# Patient Record
Sex: Female | Born: 1990 | Race: White | Hispanic: No | Marital: Single | Smoking: Former smoker
Health system: Southern US, Community
[De-identification: ages and names within clinical notes are randomized; demographics above are authoritative.]

## PROBLEM LIST (undated history)

## (undated) DIAGNOSIS — S73004A Unspecified dislocation of right hip, initial encounter: Secondary | ICD-10-CM

## (undated) DIAGNOSIS — F1120 Opioid dependence, uncomplicated: Secondary | ICD-10-CM

## (undated) DIAGNOSIS — N760 Acute vaginitis: Secondary | ICD-10-CM

## (undated) DIAGNOSIS — G43009 Migraine without aura, not intractable, without status migrainosus: Secondary | ICD-10-CM

## (undated) DIAGNOSIS — E119 Type 2 diabetes mellitus without complications: Secondary | ICD-10-CM

## (undated) DIAGNOSIS — Q6589 Other specified congenital deformities of hip: Secondary | ICD-10-CM

## (undated) DIAGNOSIS — B9689 Other specified bacterial agents as the cause of diseases classified elsewhere: Secondary | ICD-10-CM

## (undated) HISTORY — DX: Other specified bacterial agents as the cause of diseases classified elsewhere: N76.0

## (undated) HISTORY — DX: Type 2 diabetes mellitus without complications: E11.9

## (undated) HISTORY — DX: Other specified bacterial agents as the cause of diseases classified elsewhere: B96.89

## (undated) HISTORY — DX: Migraine without aura, not intractable, without status migrainosus: G43.009

---
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
## 2009-01-26 ENCOUNTER — Observation Stay: Payer: Self-pay | Admitting: Obstetrics and Gynecology

## 2009-02-09 ENCOUNTER — Observation Stay: Payer: Self-pay

## 2009-02-10 ENCOUNTER — Ambulatory Visit: Payer: Self-pay

## 2009-03-28 ENCOUNTER — Inpatient Hospital Stay: Payer: Self-pay | Admitting: Unknown Physician Specialty

## 2010-07-14 ENCOUNTER — Emergency Department: Payer: Self-pay | Admitting: Internal Medicine

## 2010-11-15 ENCOUNTER — Emergency Department: Payer: Self-pay | Admitting: Emergency Medicine

## 2010-12-02 ENCOUNTER — Emergency Department: Payer: Self-pay | Admitting: Emergency Medicine

## 2010-12-03 ENCOUNTER — Emergency Department: Payer: Self-pay | Admitting: Emergency Medicine

## 2011-09-08 ENCOUNTER — Emergency Department: Payer: Self-pay | Admitting: Emergency Medicine

## 2011-09-08 LAB — URINALYSIS, COMPLETE
Bacteria: NONE SEEN
Bilirubin,UR: NEGATIVE
Blood: NEGATIVE
Glucose,UR: NEGATIVE mg/dL
Ketone: NEGATIVE
Leukocyte Esterase: NEGATIVE
Nitrite: NEGATIVE
Ph: 6
Protein: NEGATIVE
RBC,UR: 1 /HPF
Specific Gravity: 1.021
Squamous Epithelial: 4
WBC UR: 1 /HPF

## 2011-09-08 LAB — WET PREP, GENITAL

## 2011-09-08 LAB — PREGNANCY, URINE: Pregnancy Test, Urine: NEGATIVE m[IU]/mL

## 2011-11-06 ENCOUNTER — Emergency Department: Payer: Self-pay | Admitting: Emergency Medicine

## 2012-01-12 ENCOUNTER — Emergency Department: Payer: Self-pay | Admitting: Emergency Medicine

## 2012-04-15 DIAGNOSIS — M549 Dorsalgia, unspecified: Secondary | ICD-10-CM | POA: Insufficient documentation

## 2012-04-24 ENCOUNTER — Emergency Department: Payer: Self-pay | Admitting: Emergency Medicine

## 2012-05-21 ENCOUNTER — Emergency Department: Payer: Self-pay | Admitting: Emergency Medicine

## 2012-07-12 ENCOUNTER — Emergency Department: Payer: Self-pay | Admitting: Emergency Medicine

## 2012-07-12 LAB — URINALYSIS, COMPLETE
Glucose,UR: NEGATIVE mg/dL (ref 0–75)
Ketone: NEGATIVE
Protein: 30
RBC,UR: 2 /HPF (ref 0–5)
Specific Gravity: 1.024 (ref 1.003–1.030)
Squamous Epithelial: 99
WBC UR: 6 /HPF (ref 0–5)

## 2012-07-14 LAB — URINE CULTURE

## 2013-12-18 ENCOUNTER — Emergency Department: Payer: Self-pay | Admitting: Emergency Medicine

## 2013-12-18 LAB — DRUG SCREEN, URINE
Amphetamines, Ur Screen: NEGATIVE (ref ?–1000)
BARBITURATES, UR SCREEN: NEGATIVE (ref ?–200)
Benzodiazepine, Ur Scrn: POSITIVE (ref ?–200)
Cannabinoid 50 Ng, Ur ~~LOC~~: NEGATIVE (ref ?–50)
Cocaine Metabolite,Ur ~~LOC~~: POSITIVE (ref ?–300)
MDMA (Ecstasy)Ur Screen: NEGATIVE (ref ?–500)
Methadone, Ur Screen: POSITIVE (ref ?–300)
OPIATE, UR SCREEN: NEGATIVE (ref ?–300)
Phencyclidine (PCP) Ur S: NEGATIVE (ref ?–25)
Tricyclic, Ur Screen: NEGATIVE (ref ?–1000)

## 2013-12-18 LAB — ETHANOL
Ethanol %: <0.003 % (ref 0.000–0.080)
Ethanol: <3 mg/dL

## 2013-12-18 LAB — PREGNANCY, URINE: Pregnancy Test, Urine: NEGATIVE m[IU]/mL

## 2014-02-16 ENCOUNTER — Emergency Department: Payer: Self-pay | Admitting: Emergency Medicine

## 2014-02-16 LAB — ETHANOL: Ethanol: <3 mg/dL

## 2014-02-16 LAB — DRUG SCREEN, URINE
Amphetamines, Ur Screen: NEGATIVE (ref ?–1000)
Barbiturates, Ur Screen: NEGATIVE (ref ?–200)
Benzodiazepine, Ur Scrn: POSITIVE (ref ?–200)
Cannabinoid 50 Ng, Ur ~~LOC~~: POSITIVE (ref ?–50)
Cocaine Metabolite,Ur ~~LOC~~: NEGATIVE (ref ?–300)
MDMA (ECSTASY) UR SCREEN: NEGATIVE (ref ?–500)
Methadone, Ur Screen: POSITIVE (ref ?–300)
OPIATE, UR SCREEN: NEGATIVE (ref ?–300)
PHENCYCLIDINE (PCP) UR S: NEGATIVE (ref ?–25)
Tricyclic, Ur Screen: NEGATIVE (ref ?–1000)

## 2014-02-16 LAB — BASIC METABOLIC PANEL
Anion Gap: 10 (ref 7–16)
BUN: 17 mg/dL (ref 7–18)
CHLORIDE: 109 mmol/L — AB (ref 98–107)
Calcium, Total: 9.2 mg/dL (ref 8.5–10.1)
Co2: 22 mmol/L (ref 21–32)
Creatinine: 0.75 mg/dL (ref 0.60–1.30)
EGFR (African American): >60
EGFR (Non-African Amer.): >60
GLUCOSE: 110 mg/dL — AB (ref 65–99)
OSMOLALITY: 283 (ref 275–301)
Potassium: 3.9 mmol/L (ref 3.5–5.1)
SODIUM: 141 mmol/L (ref 136–145)

## 2014-02-16 LAB — URINALYSIS, COMPLETE
BILIRUBIN, UR: NEGATIVE
BLOOD: NEGATIVE
GLUCOSE, UR: NEGATIVE mg/dL (ref 0–75)
Ketone: NEGATIVE
Leukocyte Esterase: NEGATIVE
Nitrite: NEGATIVE
Ph: 5 (ref 4.5–8.0)
RBC,UR: 3 /HPF (ref 0–5)
Specific Gravity: 1.03 (ref 1.003–1.030)
Squamous Epithelial: 9
WBC UR: 3 /HPF (ref 0–5)

## 2014-02-16 LAB — CBC
HCT: 42.5 % (ref 35.0–47.0)
HGB: 13.8 g/dL (ref 12.0–16.0)
MCH: 28.8 pg (ref 26.0–34.0)
MCHC: 32.3 g/dL (ref 32.0–36.0)
MCV: 89 fL (ref 80–100)
PLATELETS: 297 10*3/uL (ref 150–440)
RBC: 4.78 10*6/uL (ref 3.80–5.20)
RDW: 13.9 % (ref 11.5–14.5)
WBC: 11.3 10*3/uL — AB (ref 3.6–11.0)

## 2014-02-16 LAB — TSH: Thyroid Stimulating Horm: 0.884 u[IU]/mL

## 2014-06-19 ENCOUNTER — Emergency Department: Payer: Self-pay | Admitting: Emergency Medicine

## 2014-08-31 ENCOUNTER — Emergency Department: Payer: Self-pay | Admitting: Emergency Medicine

## 2014-08-31 LAB — URINALYSIS, COMPLETE
Bacteria: NONE SEEN
Bilirubin,UR: NEGATIVE
Blood: NEGATIVE
Glucose,UR: NEGATIVE mg/dL (ref 0–75)
KETONE: NEGATIVE
Leukocyte Esterase: NEGATIVE
NITRITE: NEGATIVE
PH: 5 (ref 4.5–8.0)
RBC, UR: NONE SEEN /HPF (ref 0–5)
Specific Gravity: 1.033 (ref 1.003–1.030)
Squamous Epithelial: 46
WBC UR: NONE SEEN /HPF (ref 0–5)

## 2014-08-31 LAB — COMPREHENSIVE METABOLIC PANEL
ALBUMIN: 4.4 g/dL
ANION GAP: 10 (ref 7–16)
AST: 21 U/L
Alkaline Phosphatase: 62 U/L
BILIRUBIN TOTAL: 0.4 mg/dL
BUN: 12 mg/dL
CREATININE: 0.71 mg/dL
Calcium, Total: 9.2 mg/dL
Chloride: 104 mmol/L
Co2: 25 mmol/L
EGFR (Non-African Amer.): >60
Glucose: 97 mg/dL
POTASSIUM: 4.2 mmol/L
SGPT (ALT): 13 U/L — ABNORMAL LOW
SODIUM: 139 mmol/L
TOTAL PROTEIN: 7.5 g/dL

## 2014-08-31 LAB — CBC
HCT: 40.9 % (ref 35.0–47.0)
HGB: 13.3 g/dL (ref 12.0–16.0)
MCH: 28.8 pg (ref 26.0–34.0)
MCHC: 32.4 g/dL (ref 32.0–36.0)
MCV: 89 fL (ref 80–100)
PLATELETS: 256 10*3/uL (ref 150–440)
RBC: 4.61 10*6/uL (ref 3.80–5.20)
RDW: 13.5 % (ref 11.5–14.5)
WBC: 8 10*3/uL (ref 3.6–11.0)

## 2014-08-31 LAB — DRUG SCREEN, URINE
Amphetamines, Ur Screen: NEGATIVE
BARBITURATES, UR SCREEN: NEGATIVE
Benzodiazepine, Ur Scrn: POSITIVE
COCAINE METABOLITE, UR ~~LOC~~: NEGATIVE
Cannabinoid 50 Ng, Ur ~~LOC~~: POSITIVE
MDMA (ECSTASY) UR SCREEN: NEGATIVE
Methadone, Ur Screen: POSITIVE
Opiate, Ur Screen: POSITIVE
PHENCYCLIDINE (PCP) UR S: NEGATIVE
Tricyclic, Ur Screen: NEGATIVE

## 2014-08-31 LAB — ACETAMINOPHEN LEVEL

## 2014-08-31 LAB — ETHANOL: Ethanol: <5 mg/dL

## 2014-08-31 LAB — SALICYLATE LEVEL: Salicylates, Serum: <4 mg/dL

## 2014-08-31 LAB — PREGNANCY, URINE: Pregnancy Test, Urine: NEGATIVE m[IU]/mL

## 2014-09-26 NOTE — Consult Note (Signed)
Brief Consult Note: Diagnosis: opiate abuse.   Patient was seen by consultant.   Consult note dictated.   Orders entered.   Discussed with Attending MD.   Comments: Psychiatry: Patient seen. chart reviewed. No evidence of dangerousness. IVC discontinued and can be released from ER. Full note dictated.  Electronic Signatures: Clapacs, Jackquline DenmarkJohn T (MD)  (Signed 14-Sep-15 16:24)  Authored: Brief Consult Note   Last Updated: 14-Sep-15 16:24 by Audery Amellapacs, John T (MD)
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
## 2014-10-24 ENCOUNTER — Encounter: Payer: Self-pay | Admitting: Emergency Medicine

## 2014-10-24 ENCOUNTER — Emergency Department: Payer: Medicaid Other

## 2014-10-24 ENCOUNTER — Emergency Department
Admission: EM | Admit: 2014-10-24 | Discharge: 2014-10-24 | Discharge status: Against Medical Advice | Payer: Medicaid Other | Attending: Emergency Medicine | Admitting: Emergency Medicine

## 2014-10-24 DIAGNOSIS — T148 Other injury of unspecified body region: Secondary | ICD-10-CM | POA: Diagnosis not present

## 2014-10-24 DIAGNOSIS — S0990XA Unspecified injury of head, initial encounter: Secondary | ICD-10-CM | POA: Insufficient documentation

## 2014-10-24 DIAGNOSIS — Y998 Other external cause status: Secondary | ICD-10-CM | POA: Diagnosis not present

## 2014-10-24 DIAGNOSIS — W57XXXA Bitten or stung by nonvenomous insect and other nonvenomous arthropods, initial encounter: Secondary | ICD-10-CM | POA: Insufficient documentation

## 2014-10-24 DIAGNOSIS — Y9389 Activity, other specified: Secondary | ICD-10-CM | POA: Insufficient documentation

## 2014-10-24 DIAGNOSIS — Z720 Tobacco use: Secondary | ICD-10-CM | POA: Diagnosis not present

## 2014-10-24 DIAGNOSIS — W01198A Fall on same level from slipping, tripping and stumbling with subsequent striking against other object, initial encounter: Secondary | ICD-10-CM | POA: Insufficient documentation

## 2014-10-24 DIAGNOSIS — Y9289 Other specified places as the place of occurrence of the external cause: Secondary | ICD-10-CM | POA: Insufficient documentation

## 2014-10-24 NOTE — ED Notes (Signed)
Patient to ED with report of headache from fall today, patient reports losing balance and hitting head on door handle of truck. Unsure of LOC. Patient is alert and verbally responsive in triage but slow to answer questions.

## 2014-10-24 NOTE — ED Notes (Signed)
Pt walked out of lobby and left in car per [employer] officer.

## 2014-10-24 NOTE — ED Notes (Addendum)
Pt sitting in chair, with her head back eyes closed and mouth open. When awoken pt is holding her eyes half open, speaking with slurred speech. Pt has child with her who is running around ER unsupervised. Charge RN made aware. Charge RN spoke with pt, pt called someone to pick up child. Pts child reports to RN, "my mommy argues with that man she loves so much and it makes my stomach hurt."

## 2014-10-26 ENCOUNTER — Emergency Department: Payer: Medicaid Other

## 2014-10-26 ENCOUNTER — Emergency Department
Admission: EM | Admit: 2014-10-26 | Discharge: 2014-10-27 | Disposition: A | Discharge status: Home / Self Care | Payer: Medicaid Other | Attending: Emergency Medicine | Admitting: Emergency Medicine

## 2014-10-26 DIAGNOSIS — Y998 Other external cause status: Secondary | ICD-10-CM | POA: Insufficient documentation

## 2014-10-26 DIAGNOSIS — R52 Pain, unspecified: Secondary | ICD-10-CM

## 2014-10-26 DIAGNOSIS — Y9289 Other specified places as the place of occurrence of the external cause: Secondary | ICD-10-CM | POA: Insufficient documentation

## 2014-10-26 DIAGNOSIS — S73004A Unspecified dislocation of right hip, initial encounter: Secondary | ICD-10-CM | POA: Insufficient documentation

## 2014-10-26 DIAGNOSIS — Z720 Tobacco use: Secondary | ICD-10-CM | POA: Insufficient documentation

## 2014-10-26 DIAGNOSIS — M25551 Pain in right hip: Secondary | ICD-10-CM

## 2014-10-26 DIAGNOSIS — F1110 Opioid abuse, uncomplicated: Secondary | ICD-10-CM

## 2014-10-26 DIAGNOSIS — Y9389 Activity, other specified: Secondary | ICD-10-CM | POA: Diagnosis not present

## 2014-10-26 DIAGNOSIS — F329 Major depressive disorder, single episode, unspecified: Secondary | ICD-10-CM

## 2014-10-26 DIAGNOSIS — S79911A Unspecified injury of right hip, initial encounter: Secondary | ICD-10-CM | POA: Diagnosis present

## 2014-10-26 DIAGNOSIS — F1410 Cocaine abuse, uncomplicated: Secondary | ICD-10-CM

## 2014-10-26 DIAGNOSIS — M25559 Pain in unspecified hip: Secondary | ICD-10-CM

## 2014-10-26 DIAGNOSIS — W1839XA Other fall on same level, initial encounter: Secondary | ICD-10-CM | POA: Diagnosis not present

## 2014-10-26 DIAGNOSIS — F32A Depression, unspecified: Secondary | ICD-10-CM

## 2014-10-26 HISTORY — DX: Unspecified dislocation of right hip, initial encounter: S73.004A

## 2014-10-26 LAB — CBC WITH DIFFERENTIAL/PLATELET
BASOS ABS: 0 10*3/uL (ref 0–0.1)
Basophils Relative: 1 %
Eosinophils Absolute: 0.2 10*3/uL (ref 0–0.7)
Eosinophils Relative: 3 %
HCT: 35.2 % (ref 35.0–47.0)
HEMOGLOBIN: 11.7 g/dL — AB (ref 12.0–16.0)
Lymphocytes Relative: 30 %
Lymphs Abs: 2.7 10*3/uL (ref 1.0–3.6)
MCH: 29.5 pg (ref 26.0–34.0)
MCHC: 33.2 g/dL (ref 32.0–36.0)
MCV: 88.6 fL (ref 80.0–100.0)
MONOS PCT: 9 %
Monocytes Absolute: 0.8 10*3/uL (ref 0.2–0.9)
NEUTROS ABS: 5.2 10*3/uL (ref 1.4–6.5)
Neutrophils Relative %: 57 %
Platelets: 195 10*3/uL (ref 150–440)
RBC: 3.98 MIL/uL (ref 3.80–5.20)
RDW: 13.2 % (ref 11.5–14.5)
WBC: 9 10*3/uL (ref 3.6–11.0)

## 2014-10-26 MED ORDER — SODIUM CHLORIDE 0.9 % IV BOLUS (SEPSIS)
1000.0000 mL | Freq: Once | INTRAVENOUS | Status: AC
  Administered 2014-10-26: 1000 mL via INTRAVENOUS

## 2014-10-26 NOTE — ED Notes (Signed)
Pt to ED via EMS c/o right hip pain today.  Per EMS pt dislocated hip at home on it's own.  Pt has hx of same, normally pt can reset hip.  Pt states pain to right hip and right groin area.  Deformity noted to right hip area.  Pt pedal pulses strong and cap refill <3 seconds bilaterally.  Pt states decreased sensation to right foot.  Pt appears extremely drowsy with poor attention.  Pt states on depression medications x2 weeks, effexor and xanax.  Pt states only taking 2 effexor today.  Pt lungs clear x4, pupils 3mm and reactive to light equally.
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
## 2014-10-26 NOTE — ED Notes (Signed)
Patient transported to X-ray 

## 2014-10-27 ENCOUNTER — Encounter: Payer: Self-pay | Admitting: Emergency Medicine

## 2014-10-27 DIAGNOSIS — F1410 Cocaine abuse, uncomplicated: Secondary | ICD-10-CM

## 2014-10-27 DIAGNOSIS — M25559 Pain in unspecified hip: Secondary | ICD-10-CM

## 2014-10-27 DIAGNOSIS — F1110 Opioid abuse, uncomplicated: Secondary | ICD-10-CM

## 2014-10-27 DIAGNOSIS — F329 Major depressive disorder, single episode, unspecified: Secondary | ICD-10-CM

## 2014-10-27 DIAGNOSIS — F32A Depression, unspecified: Secondary | ICD-10-CM

## 2014-10-27 LAB — COMPREHENSIVE METABOLIC PANEL
ALT: 14 U/L (ref 14–54)
AST: 21 U/L (ref 15–41)
Albumin: 3.7 g/dL (ref 3.5–5.0)
Alkaline Phosphatase: 48 U/L (ref 38–126)
Anion gap: 8 (ref 5–15)
BUN: 13 mg/dL (ref 6–20)
CALCIUM: 8.6 mg/dL — AB (ref 8.9–10.3)
CO2: 24 mmol/L (ref 22–32)
CREATININE: 0.72 mg/dL (ref 0.44–1.00)
Chloride: 110 mmol/L (ref 101–111)
GFR calc Af Amer: >60 mL/min (ref >60)
GLUCOSE: 76 mg/dL (ref 65–99)
Potassium: 3.7 mmol/L (ref 3.5–5.1)
Sodium: 142 mmol/L (ref 135–145)
TOTAL PROTEIN: 6.5 g/dL (ref 6.5–8.1)
Total Bilirubin: <0.1 mg/dL — ABNORMAL LOW (ref 0.3–1.2)

## 2014-10-27 LAB — URINALYSIS COMPLETE WITH MICROSCOPIC (ARMC ONLY)
Bilirubin Urine: NEGATIVE
Glucose, UA: NEGATIVE mg/dL
KETONES UR: NEGATIVE mg/dL
Nitrite: NEGATIVE
PH: 7 (ref 5.0–8.0)
Protein, ur: NEGATIVE mg/dL
Specific Gravity, Urine: 1.02 (ref 1.005–1.030)

## 2014-10-27 LAB — URINE DRUG SCREEN, QUALITATIVE (ARMC ONLY)
AMPHETAMINES, UR SCREEN: NOT DETECTED
BENZODIAZEPINE, UR SCRN: POSITIVE — AB
Barbiturates, Ur Screen: NOT DETECTED
Cannabinoid 50 Ng, Ur ~~LOC~~: NOT DETECTED
Cocaine Metabolite,Ur ~~LOC~~: POSITIVE — AB
MDMA (ECSTASY) UR SCREEN: NOT DETECTED
Methadone Scn, Ur: POSITIVE — AB
Opiate, Ur Screen: NOT DETECTED
PHENCYCLIDINE (PCP) UR S: NOT DETECTED
Tricyclic, Ur Screen: NOT DETECTED

## 2014-10-27 LAB — SALICYLATE LEVEL: Salicylate Lvl: <4 mg/dL (ref 2.8–30.0)

## 2014-10-27 LAB — ACETAMINOPHEN LEVEL: Acetaminophen (Tylenol), Serum: <10 ug/mL — ABNORMAL LOW (ref 10–30)

## 2014-10-27 LAB — PREGNANCY, URINE: PREG TEST UR: NEGATIVE

## 2014-10-27 LAB — ETHANOL

## 2014-10-27 MED ORDER — KETOROLAC TROMETHAMINE 10 MG PO TABS
10.0000 mg | ORAL_TABLET | Freq: Once | ORAL | Status: AC
  Administered 2014-10-27: 10 mg via ORAL

## 2014-10-27 MED ORDER — KETOROLAC TROMETHAMINE 10 MG PO TABS
ORAL_TABLET | ORAL | Status: AC
  Administered 2014-10-27: 10 mg via ORAL
  Filled 2014-10-27: qty 1

## 2014-10-27 NOTE — ED Notes (Signed)
BEHAVIORAL HEALTH ROUNDING Patient sleeping: Yes.   Patient alert and oriented: yes Behavior appropriate: Yes.  , sleeping Nutrition and fluids offered: No, sleeping Toileting and hygiene offered: No, pt sleeping Sitter present: yes Law enforcement present: Yes   ENVIRONMENTAL ASSESSMENT Potentially harmful objects out of patient reach: Yes.   Personal belongings secured: Yes.   Patient dressed in hospital provided attire only: Yes.   Plastic bags out of patient reach: Yes.   Patient care equipment (cords, cables, call bells, lines, and drains) shortened, removed, or accounted for: Yes.   Equipment and supplies removed from bottom of stretcher: Yes.   Potentially toxic materials out of patient reach: Yes.   Sharps container removed or out of patient reach: Yes.

## 2014-10-27 NOTE — ED Notes (Signed)
Patient assigned to appropriate care area. Patient oriented to unit/care area: Informed that, for their safety, care areas are designed for safety and monitored by security cameras at all times; and visiting hours explained to patient. Patient verbalizes understanding, and verbal contract for safety obtained. 

## 2014-10-27 NOTE — ED Provider Notes (Signed)
11:25 PM I was informed that patient was very somnolent and altered since presentation to the emergency department with concern of possible intoxication. I interviewed by me after assuming care from Dr. Lenard LancePaDUCHOWSKI. Patient admitted to taking 2 tablets of Methadone today before arrival to the emergency department. She stated "it's okay he (son) was aching a nap anyways" Patient's boyfriend arrived and informed me at the patient's bedside after she gave permission for him to be there that the patient "took too much methadone" on Saturday as well. Patient boyfriend states that she needs help because she keeps doing this. Patient was home alone with her 25-year-old son. His services were notified. Potential risks to the child. Patient was involuntarily committed based on risk to herself as well as her child  Darci Currentandolph N Brown, MD 10/27/14 [phone]

## 2014-10-27 NOTE — ED Notes (Signed)
ENVIRONMENTAL ASSESSMENT Potentially harmful objects out of patient reach: No. Personal belongings secured: Yes.   Patient dressed in hospital provided attire only: Yes.   Plastic bags out of patient reach: Yes.   Patient care equipment (cords, cables, call bells, lines, and drains) shortened, removed, or accounted for: Yes.   Equipment and supplies removed from bottom of stretcher: Yes.   Potentially toxic materials out of patient reach: Yes.   Sharps container removed or out of patient reach: No.   Pt being seen for overdose without intent.  Pt denies SI.

## 2014-10-27 NOTE — ED Notes (Signed)
BEHAVIORAL HEALTH ROUNDING Patient sleeping: Yes.   Patient alert and oriented: not applicable Behavior appropriate: Yes.   Nutrition and fluids offered: Yes  Toileting and hygiene offered: Yes  Sitter present: yes Law enforcement present: Yes  

## 2014-10-27 NOTE — ED Notes (Signed)
BEHAVIORAL HEALTH ROUNDING Patient sleeping: No. Patient alert and oriented: yes Behavior appropriate: Yes.  ;  Nutrition and fluids offered: Yes  Toileting and hygiene offered: Yes  Sitter present: yes Law enforcement present: Yes  

## 2014-10-27 NOTE — ED Notes (Signed)
BEHAVIORAL HEALTH ROUNDING Patient sleeping: Yes.   Patient alert and oriented: not applicable Behavior appropriate: Yes.  ; If no, describe:  Nutrition and fluids offered: Yes  Toileting and hygiene offered: Yes  Sitter present: no Law enforcement present: Yes  

## 2014-10-27 NOTE — ED Notes (Signed)
Pt given food tray.

## 2014-10-27 NOTE — Discharge Instructions (Signed)
Please seek medical attention for any high fevers, chest pain, shortness of breath, change in behavior, persistent vomiting, bloody stool or any other new or concerning symptoms. Narcotic Overdose A narcotic overdose is the misuse or overuse of a narcotic drug. A narcotic overdose can make you pass out and stop breathing. If you are not treated right away, this can cause permanent brain damage or stop your heart. Medicine may be given to reverse the effects of an overdose. If so, this medicine may bring on withdrawal symptoms. The symptoms may be abdominal cramps, throwing up (vomiting), sweating, chills, and nervousness. Injecting narcotics can cause more problems than just an overdose. AIDS, hepatitis, and other very serious infections are transmitted by sharing needles and syringes. If you decide to quit using, there are medicines which can help you through the withdrawal period. Trying to quit all at once on your own can be uncomfortable, but not life-threatening. Call your caregiver, Narcotics Anonymous, or any drug and alcohol treatment program for further help.  Document Released: 06/29/2004 Document Revised: 08/14/2011 Document Reviewed: 04/23/2009 [facility] Patient Information 2015 [employer] [place]. This information is not intended to replace advice given to you by your health care provider. Make sure you discuss any questions you have with your health care provider.

## 2014-10-27 NOTE — ED Notes (Signed)
BEHAVIORAL HEALTH ROUNDING Patient sleeping: No. Patient alert and oriented: yes Behavior appropriate: Yes.  ; If no, describe:  Nutrition and fluids offered: Yes  Toileting and hygiene offered: Yes  Sitter present: no Law enforcement present: Yes  
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
## 2014-10-27 NOTE — ED Notes (Signed)
[facility]

## 2014-10-27 NOTE — ED Notes (Signed)
Pt escorted to room 4 via Engineer, materialssecurity officer.  Pt dressed out in burgundy scrubs and pts valuables were bagged, labelled, and given to RN.  Pt laying quietly on stretcher.  Pt stated "I asked to have IV taken out and if its not, I will take it out myself."

## 2014-10-27 NOTE — ED Provider Notes (Signed)
-----------------------------------------   1:13 PM on 10/27/2014 -----------------------------------------  Patient has been seen by Dr. Toni Amendlapacs with psychiatry who has rescinded the IVC.  Phineas SemenGraydon Bron Snellings, MD 10/27/14 1315
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
## 2014-12-20 ENCOUNTER — Emergency Department: Payer: Self-pay

## 2014-12-20 ENCOUNTER — Encounter: Payer: Self-pay | Admitting: Emergency Medicine

## 2014-12-20 ENCOUNTER — Emergency Department
Admission: EM | Admit: 2014-12-20 | Discharge: 2014-12-20 | Disposition: A | Discharge status: Home / Self Care | Payer: Self-pay | Attending: Emergency Medicine | Admitting: Emergency Medicine

## 2014-12-20 DIAGNOSIS — T50904A Poisoning by unspecified drugs, medicaments and biological substances, undetermined, initial encounter: Secondary | ICD-10-CM

## 2014-12-20 DIAGNOSIS — F1110 Opioid abuse, uncomplicated: Secondary | ICD-10-CM

## 2014-12-20 DIAGNOSIS — Z79899 Other long term (current) drug therapy: Secondary | ICD-10-CM | POA: Insufficient documentation

## 2014-12-20 DIAGNOSIS — T424X1A Poisoning by benzodiazepines, accidental (unintentional), initial encounter: Secondary | ICD-10-CM | POA: Insufficient documentation

## 2014-12-20 DIAGNOSIS — F1210 Cannabis abuse, uncomplicated: Secondary | ICD-10-CM | POA: Insufficient documentation

## 2014-12-20 DIAGNOSIS — Z720 Tobacco use: Secondary | ICD-10-CM | POA: Insufficient documentation

## 2014-12-20 DIAGNOSIS — R4781 Slurred speech: Secondary | ICD-10-CM | POA: Insufficient documentation

## 2014-12-20 DIAGNOSIS — F1310 Sedative, hypnotic or anxiolytic abuse, uncomplicated: Secondary | ICD-10-CM | POA: Insufficient documentation

## 2014-12-20 DIAGNOSIS — F1120 Opioid dependence, uncomplicated: Secondary | ICD-10-CM | POA: Insufficient documentation

## 2014-12-20 LAB — CBC
HEMATOCRIT: 36.9 % (ref 35.0–47.0)
Hemoglobin: 12.4 g/dL (ref 12.0–16.0)
MCH: 29.5 pg (ref 26.0–34.0)
MCHC: 33.5 g/dL (ref 32.0–36.0)
MCV: 88 fL (ref 80.0–100.0)
Platelets: 261 10*3/uL (ref 150–440)
RBC: 4.19 MIL/uL (ref 3.80–5.20)
RDW: 13.4 % (ref 11.5–14.5)
WBC: 9.8 10*3/uL (ref 3.6–11.0)

## 2014-12-20 LAB — COMPREHENSIVE METABOLIC PANEL
ALK PHOS: 53 U/L (ref 38–126)
ALT: 19 U/L (ref 14–54)
ANION GAP: 10 (ref 5–15)
AST: 28 U/L (ref 15–41)
Albumin: 4.3 g/dL (ref 3.5–5.0)
BILIRUBIN TOTAL: 0.2 mg/dL — AB (ref 0.3–1.2)
BUN: 17 mg/dL (ref 6–20)
CO2: 25 mmol/L (ref 22–32)
CREATININE: 0.75 mg/dL (ref 0.44–1.00)
Calcium: 9.3 mg/dL (ref 8.9–10.3)
Chloride: 106 mmol/L (ref 101–111)
GFR calc Af Amer: >60 mL/min (ref >60)
Glucose, Bld: 98 mg/dL (ref 65–99)
Potassium: 3.3 mmol/L — ABNORMAL LOW (ref 3.5–5.1)
SODIUM: 141 mmol/L (ref 135–145)
Total Protein: 7.2 g/dL (ref 6.5–8.1)

## 2014-12-20 LAB — URINALYSIS COMPLETE WITH MICROSCOPIC (ARMC ONLY)
Bacteria, UA: NONE SEEN
Bilirubin Urine: NEGATIVE
GLUCOSE, UA: NEGATIVE mg/dL
HGB URINE DIPSTICK: NEGATIVE
Ketones, ur: NEGATIVE mg/dL
LEUKOCYTES UA: NEGATIVE
NITRITE: NEGATIVE
PROTEIN: NEGATIVE mg/dL
RBC / HPF: NONE SEEN RBC/hpf (ref 0–5)
SPECIFIC GRAVITY, URINE: 1.026 (ref 1.005–1.030)
Squamous Epithelial / LPF: NONE SEEN
pH: 5 (ref 5.0–8.0)

## 2014-12-20 LAB — HCG, QUANTITATIVE, PREGNANCY: hCG, Beta Chain, Quant, S: <1 m[IU]/mL (ref <5)

## 2014-12-20 LAB — ACETAMINOPHEN LEVEL: Acetaminophen (Tylenol), Serum: <10 ug/mL — ABNORMAL LOW (ref 10–30)

## 2014-12-20 LAB — ETHANOL

## 2014-12-20 LAB — URINE DRUG SCREEN, QUALITATIVE (ARMC ONLY)
Amphetamines, Ur Screen: NOT DETECTED
BARBITURATES, UR SCREEN: NOT DETECTED
Benzodiazepine, Ur Scrn: POSITIVE — AB
Cannabinoid 50 Ng, Ur ~~LOC~~: POSITIVE — AB
Cocaine Metabolite,Ur ~~LOC~~: NOT DETECTED
MDMA (ECSTASY) UR SCREEN: NOT DETECTED
METHADONE SCREEN, URINE: POSITIVE — AB
Opiate, Ur Screen: POSITIVE — AB
Phencyclidine (PCP) Ur S: NOT DETECTED
Tricyclic, Ur Screen: NOT DETECTED

## 2014-12-20 LAB — SALICYLATE LEVEL: Salicylate Lvl: <4 mg/dL (ref 2.8–30.0)

## 2014-12-20 MED ORDER — SODIUM CHLORIDE 0.9 % IV BOLUS (SEPSIS)
1000.0000 mL | Freq: Once | INTRAVENOUS | Status: AC
  Administered 2014-12-20: 1000 mL via INTRAVENOUS

## 2014-12-20 NOTE — ED Notes (Signed)

## 2014-12-20 NOTE — ED Notes (Signed)
Pt easily awaken and placed in a reclining position. Offered food and declined. Offered toileting and declined. Per report pt to be moved to quad for psych eval and pt informed

## 2014-12-20 NOTE — ED Notes (Signed)
Pt up and eating dinner 
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
## 2014-12-20 NOTE — ED Notes (Addendum)
Social workers at bedside

## 2014-12-20 NOTE — ED Notes (Signed)
Pt to be discharged per psychiatry.

## 2014-12-20 NOTE — ED Provider Notes (Signed)
-----------------------------------------   7:18 PM on 12/20/2014 -----------------------------------------   Blood pressure 107/76, pulse 84, resp. rate 20, weight 125 lb (56.7 kg), SpO2 99 %.  The patient had no acute events since last update.  Calm and cooperative at this time.  Care discussed with psychiatrist Dr. Dorene Grebehawla in the ED after her evaluation. She will feels that the patient is psychiatrically stable and has appropriate goals oriented mindset and is willing to [facility]. Also give her information on R a. She is medically stable at this time with normal vital signs. Discharge home   Sharman CheekPhillip Marlyn Tondreau, MD 12/20/14 1919

## 2014-12-20 NOTE — ED Notes (Signed)
Patient states she does not know why she is here all she remembers is being released from jail yesterday.  She becomes upset and begins raising her voice with concern for her child.  States that her child [place] with a man named Don.  No relationship of this person was given.  Phone number was provided and patient gave permission to call Roe Coombson.

## 2014-12-20 NOTE — Progress Notes (Signed)
Counselor provided outpatient information for substance abuse treatment per request of MD. Patient verbalized understanding. No other concerns verbalized.
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
## 2014-12-20 NOTE — ED Notes (Signed)
Pt presents to ER via [employer]. Pt is having slurred speech, resp even and unlabored. Pt reportedly took "1" Pt is extremely unsteady.

## 2014-12-20 NOTE — ED Notes (Signed)
BEHAVIORAL HEALTH ROUNDING Patient sleeping: Yes.   Patient alert and oriented: not applicable Behavior appropriate: Yes.  ; If no, describe:  Nutrition and fluids offered: Yes  Toileting and hygiene offered: Yes  Sitter present: yes Law enforcement present: Yes  

## 2014-12-20 NOTE — ED Notes (Signed)
BEHAVIORAL HEALTH ROUNDING Patient sleeping: Yes.   Patient alert and oriented: not applicable Behavior appropriate: Yes.  ; If no, describe:  Nutrition and fluids offered: Patient sleeping Toileting and hygiene offered: Patient sleeping Sitter present: safety checks every 15 minutes Law enforcement present: Engineer, materialssecurity officer present

## 2014-12-20 NOTE — ED Notes (Signed)
BEHAVIORAL HEALTH ROUNDING Patient sleeping: Yes.   Patient alert and oriented: not applicable Behavior appropriate: Yes.  ; If no, describe:  Nutrition and fluids offered: No Toileting and hygiene offered: No Sitter present: not applicable Law enforcement present: Yes OD 

## 2014-12-20 NOTE — [facility] Counselor (Signed)
Unable to assess pt at this time- drowsy/sleep

## 2014-12-20 NOTE — ED Notes (Signed)
Pt changed into scrubs and moved to room 23. Pt asking to call her boyfriend. Pt given phone

## 2014-12-20 NOTE — ED Notes (Signed)
BEHAVIORAL HEALTH ROUNDING Patient sleeping: Yes.   Patient alert and oriented: not applicable Behavior appropriate: yes; If no, describe:  Nutrition and fluids offered: No Toileting and hygiene offered: No Sitter present: no Law enforcement present: Yes OD

## 2014-12-20 NOTE — BH Assessment (Signed)
Assessment Note  Kristine Griffin is an 24 y.o. female. Pt presents to ED as involuntary; transported by the [employer]. Pt was not a reliable informant with great inability to recall the events that led her to being transported to the hospital. She initially was unresponsive to staff and refusing to provide information. Per report of police department, she was unresponsive at the time she was picked up. Shaquana became agitated when TTS Intake attempted to complete assessment. She reported, "I will answer your questions when you tell me why I am here". Pt was unable to report the whereabouts of her 53 yo son, who was reported to have been with patient prior to being transported to the ED. She states he was with an individual named "John"; when asked about her relationship with this individual she stated "does that matter?" Later, pt reported her son was "at a snow camp but I'm not allowed to give the address to the camp". After multiple failed attempts to gather assessment information; TTS Intake contacted collateral to gather additional information. Sakeena then reports her son was with a gentleman by the name of Jonny Ruiz [place]unn[place]: [phone]; call attempts made twice by TTS Intake, received no answer.  Judithann became very agitated and irritable when speaking with nurse and Intake counselors. Pt stated, "I don't know what the fuck y'all put in my IV, I was suppose to get out today. I was suppose to see my son today. I got out yesterday (referring to incarceration), I was suppose to be there to surprise him. He thought I was coming home on Saturday and now it's Sunday". The exact whereabouts of the child were unknown until the ED nurse called Intake to inform that the child was in the care of the patient's father Stephenie Acres: [phone]). After given permission by pt to make contact with Alonna Minium to confirm safety of the child, call was made and collateral information was provided. Stephenie Acres reports he  was contacted by the [employer] to pick up pt's 5 yo son. He reports the police told him they were going to either allow the child to go with him or they would have to call CPS. He reports he opted to take the child with him. He states pt's was recently incarcerated for 31 days; referred to [facility] at the time she was released from jail, pt refused treatment. He reports pt to be influenced by "her drug dealer boyfriend". He further reports "this has been a problem for sometime and now she is back on pills again".  Pt's UDS was positive for opiates, benzodiazepines, marijuana, and methadone. Pt denied SI/HI/AH/VH; although, pt appeared to have scars on her left forearm from past cutting ("3 years ago" per pt report).   Axis I: Substance Abuse and Opioid Use Disorder, Cannabis Use Disorder, and Sedatives, hypnotics, and anxiolytics Use Disorder Axis II: Deferred Axis III:  Past Medical History  Diagnosis Date  . Hip dislocation, right     x2   Axis IV: other psychosocial or environmental problems, problems related to legal system/crime and problems with primary support group Axis V: 41-50 serious symptoms  Past Medical History:  Past Medical History  Diagnosis Date  . Hip dislocation, right     x2    History reviewed. No pertinent past surgical history.  Family History: History reviewed. No pertinent family history.  Social History:  reports that she has been smoking.  She has never used smokeless tobacco. She reports that she drinks  alcohol. She reports that she does not use illicit drugs.  Additional Social History:  Alcohol / Drug Use History of alcohol / drug use?:  (Pt refused to provide information; per UDS pt positive for substances)  CIWA: CIWA-Ar BP: 107/76 mmHg Pulse Rate: 84 COWS:    Allergies: No Known Allergies  Home Medications:  (Not in a hospital admission)  OB/GYN Status:  No LMP recorded. Patient has had an implant.  General Assessment  [facility] T[facility] Assessment: In system Is this a Tele or Face-to-Face Assessment?: Face-to-Face Is this an Initial Assessment or a Re-assessment for this encounter?: Initial Assessment Marital status: Single Maiden name: N/A Is patient pregnant?: No Pregnancy Status: No Living Arrangements: Other (Comment) (Pt refused to provide information) Can pt return to current living arrangement?:  (Unknown) Admission Status: Involuntary Is patient capable of signing voluntary admission?: No Referral Source: Other Teacher, English as a foreign language([place] [employer]) Insurance type: None  Medical Screening Exam [facility] Walk-in ONLY) Medical Exam completed: Yes  Crisis Care Plan Living Arrangements: Other (Comment) (Pt refused to provide information) Name of Psychiatrist: N/A Name of Therapist: N/A  Education Status Is patient currently in school?: No Current Grade: N/A Highest grade of school patient has completed: N/A Name of school: N/A Contact person: N/A  Risk to self with the past 6 months Suicidal Ideation: No Has patient been a risk to self within the past 6 months prior to admission? : Yes Suicidal Intent: No Has patient had any suicidal intent within the past 6 months prior to admission? : Other (comment) (None reported) Is patient at risk for suicide?: No Suicidal Plan?: No Has patient had any suicidal plan within the past 6 months prior to admission? : No Access to Means: No What has been your use of drugs/alcohol within the last 12 months?:  (Pt refused to provide information) Previous Attempts/Gestures: Yes (Pt reports Hx of cutting her arm) How many times?:  (Unknown) Triggers for Past Attempts: None known Intentional Self Injurious Behavior: Cutting Comment - Self Injurious Behavior:  (Pt reports cuts on forearm are from "3 years ago") Family Suicide History: Unknown Recent stressful life event(s): Legal Issues (Pt recently released from jail.) Persecutory voices/beliefs?:  No Depression: Yes Depression Symptoms: Feeling angry/irritable, Tearfulness, Guilt Substance abuse history and/or treatment for substance abuse?: No (Ct refused to provide information regarding substance use) Suicide prevention information given to non-admitted patients: Yes  Risk to Others within the past 6 months Homicidal Ideation: No Does patient have any lifetime risk of violence toward others beyond the six months prior to admission? : No Thoughts of Harm to Others: No Current Homicidal Intent: No Current Homicidal Plan: No Access to Homicidal Means: No Identified Victim: N/A History of harm to others?: No Assessment of Violence: None Noted Violent Behavior Description: N/A Does patient have access to weapons?: No Criminal Charges Pending?: Yes Describe Pending Criminal Charges:  Engineer, drilling(Misdemeanor larceny) Does patient have a court date: Yes Court Date:  (December 29, 2014; January 22, 2015 ) Is patient on probation?: Yes  Psychosis Hallucinations: None noted Delusions: None noted  Mental Status Report Appearance/Hygiene: In scrubs, In hospital gown Eye Contact: Fair Motor Activity: Unremarkable Speech: Slurred Level of Consciousness: Drowsy, Alert, Irritable Mood: Depressed, Anxious, Irritable, Guilty Affect: Irritable, Depressed Anxiety Level: Moderate Thought Processes: Circumstantial Judgement: Partial Orientation: Person, Place, Time, Situation, Appropriate for developmental age Obsessive Compulsive Thoughts/Behaviors: None  Cognitive Functioning Concentration: Fair Memory: Recent Impaired IQ: Average Insight: Poor Impulse Control: Poor Appetite: Fair Weight  Loss: 0 Weight Gain: 0 Sleep: Unable to Assess Vegetative Symptoms: None  ADLScreening [facility] Assessment Services) Patient's cognitive ability adequate to safely complete daily activities?: Yes Patient able to express need for assistance with ADLs?: Yes Independently performs ADLs?: Yes (appropriate for  developmental age)  Prior Inpatient Therapy Prior Inpatient Therapy: No Prior Therapy Dates: N/A Prior Therapy Facilty/Provider(s): N/A Reason for Treatment: N/A  Prior Outpatient Therapy Prior Outpatient Therapy: No Prior Therapy Dates: N/A Prior Therapy Facilty/Provider(s): N/A Reason for Treatment: N/A Does patient have an ACCT team?: No Does patient have Intensive In-House Services?  : No Does patient have [facility] services? : No Does patient have P4CC services?: No  ADL Screening (condition at time of admission) Patient's cognitive ability adequate to safely complete daily activities?: Yes Patient able to express need for assistance with ADLs?: Yes Independently performs ADLs?: Yes (appropriate for developmental age)       Abuse/Neglect Assessment (Assessment to be complete while patient is alone) Physical Abuse: Denies Verbal Abuse: Denies Sexual Abuse: Denies Exploitation of patient/patient's resources: Denies Self-Neglect: Denies Values / Beliefs Cultural Requests During Hospitalization: None Spiritual Requests During Hospitalization: None Consults Spiritual Care Consult Needed: No Social Work Consult Needed: No      Additional Information 1:1 In Past 12 Months?: No CIRT Risk: No Elopement Risk: No Does patient have medical clearance?: Yes  Child/Adolescent Assessment Running Away Risk: Denies Bed-Wetting: Denies Destruction of Property: Denies Cruelty to Animals: Denies Stealing: Denies Rebellious/Defies Authority: Denies Satanic Involvement: Denies Archivist: Denies Problems at [employer]: Denies Gang Involvement: Denies  Disposition:  Disposition Initial Assessment Completed for this Encounter: Yes Disposition of Patient: Referred to (Psych MD)  On Site Evaluation by:   Reviewed with Physician:    Wilmon Arms 12/20/2014 11:42 AM

## 2014-12-20 NOTE — Consult Note (Signed)
[place] [facility] Consult   Reason for Consult:  Follow up Referring Physician:  Er Patient Identification: Kristine Griffin MRN:  [id number] Principal Diagnosis: opioid dependence and is in Methadone. Diagnosis:   Patient Active Problem List   Diagnosis Date Noted  . Opiate abuse, episodic [F11.10] 10/27/2014  . Cocaine abuse [F14.10] 10/27/2014  . Hip pain [M25.559] 10/27/2014  . Depression [F32.9] 10/27/2014    Total Time spent with patient: 45 minutes  Subjective:   Kristine Griffin is a 24 y.o. female patient admitted with  Conflicts with boy friend and recently released from jail and attends Methadone Program in [place] and is employed by her Kristine Griffin and does not appear to have a definite place to stay and her 43 yr old son is with her grand parents .Kristine Griffin  HPI:  Pt was asked to leave her boy friend's car and she is here by ambulance and cannot give good details.   Pt reports that she lives in a 2 BR place with her 11 yr old son and is eager to go back to him. Being followed at [facility] clinic in addition to methadone clinic HPI Elements:     Past Medical History:  Past Medical History  Diagnosis Date  . Hip dislocation, right     x2   History reviewed. No pertinent past surgical history. Family History: History reviewed. No pertinent family history. Social History:  History  Alcohol Use  . Yes     History  Drug Use No    History   Social History  . Marital Status: Single    Spouse Name: N/A  . Number of Children: N/A  . Years of Education: N/A   Social History Main Topics  . Smoking status: Current Every Day Smoker  . Smokeless tobacco: Never Used  . Alcohol Use: Yes  . Drug Use: No  . Sexual Activity: Not on file   Other Topics Concern  . None   Social History Narrative   Additional Social History:    History of alcohol / drug use?:  (Pt refused to provide information; per UDS pt positive for substances)                      Allergies:  No Known Allergies  Labs:  Results for orders placed or performed during the hospital encounter of 12/20/14 (from the past 48 hour(s))  Comprehensive metabolic panel     Status: Abnormal   Collection Time: 12/20/14 12:47 AM  Result Value Ref Range   Sodium 141 135 - 145 mmol/L   Potassium 3.3 (L) 3.5 - 5.1 mmol/L   Chloride 106 101 - 111 mmol/L   CO2 25 22 - 32 mmol/L   Glucose, Bld 98 65 - 99 mg/dL   BUN 17 6 - 20 mg/dL   Creatinine, Ser 0.75 0.44 - 1.00 mg/dL   Calcium 9.3 8.9 - 10.3 mg/dL   Total Protein 7.2 6.5 - 8.1 g/dL   Albumin 4.3 3.5 - 5.0 g/dL   AST 28 15 - 41 U/L   ALT 19 14 - 54 U/L   Alkaline Phosphatase 53 38 - 126 U/L   Total Bilirubin 0.2 (L) 0.3 - 1.2 mg/dL   GFR calc non Af Amer >60 >60 mL/min   GFR calc Af Amer >60 >60 mL/min    Comment: (NOTE) The eGFR has been calculated using the CKD EPI equation. This calculation has not been validated in all clinical situations. eGFR's persistently <  60 mL/min signify possible Chronic Kidney Disease.    Anion gap 10 5 - 15  Ethanol (ETOH)     Status: None   Collection Time: 12/20/14 12:47 AM  Result Value Ref Range   Alcohol, Ethyl (B) <5 <5 mg/dL    Comment:        LOWEST DETECTABLE LIMIT FOR SERUM ALCOHOL IS 5 mg/dL FOR MEDICAL PURPOSES ONLY   Salicylate level     Status: None   Collection Time: 12/20/14 12:47 AM  Result Value Ref Range   Salicylate Lvl <7.3 2.8 - 30.0 mg/dL  Acetaminophen level     Status: Abnormal   Collection Time: 12/20/14 12:47 AM  Result Value Ref Range   Acetaminophen (Tylenol), Serum <10 (L) 10 - 30 ug/mL    Comment:        THERAPEUTIC CONCENTRATIONS VARY SIGNIFICANTLY. A RANGE OF 10-30 ug/mL MAY BE AN EFFECTIVE CONCENTRATION FOR MANY PATIENTS. HOWEVER, SOME ARE BEST TREATED AT CONCENTRATIONS OUTSIDE THIS RANGE. ACETAMINOPHEN CONCENTRATIONS >150 ug/mL AT 4 HOURS AFTER INGESTION AND >50 ug/mL AT 12 HOURS AFTER INGESTION ARE OFTEN ASSOCIATED WITH  TOXIC REACTIONS.   CBC     Status: None   Collection Time: 12/20/14 12:47 AM  Result Value Ref Range   WBC 9.8 3.6 - 11.0 K/uL   RBC 4.19 3.80 - 5.20 MIL/uL   Hemoglobin 12.4 12.0 - 16.0 g/dL   HCT 36.9 35.0 - 47.0 %   MCV 88.0 80.0 - 100.0 fL   MCH 29.5 26.0 - 34.0 pg   MCHC 33.5 32.0 - 36.0 g/dL   RDW 13.4 11.5 - 14.5 %   Platelets 261 150 - 440 K/uL  hCG, quantitative, pregnancy     Status: None   Collection Time: 12/20/14 12:47 AM  Result Value Ref Range   hCG, Beta Chain, Quant, S <1 <5 mIU/mL    Comment:          GEST. AGE      CONC.  (mIU/mL)   <=1 WEEK        5 - 50     2 WEEKS       50 - 500     3 WEEKS       100 - 10,000     4 WEEKS     1,000 - 30,000     5 WEEKS     3,500 - 115,000   6-8 WEEKS     12,000 - 270,000    12 WEEKS     15,000 - 220,000        FEMALE AND NON-PREGNANT FEMALE:     LESS THAN 5 mIU/mL   Urine Drug Screen, Qualitative ([facility] only)     Status: Abnormal   Collection Time: 12/20/14  3:33 AM  Result Value Ref Range   Tricyclic, Ur Screen NONE DETECTED NONE DETECTED   Amphetamines, Ur Screen NONE DETECTED NONE DETECTED   MDMA (Ecstasy)Ur Screen NONE DETECTED NONE DETECTED   Cocaine Metabolite,Ur [place] NONE DETECTED NONE DETECTED   Opiate, Ur Screen POSITIVE (A) NONE DETECTED   Phencyclidine (PCP) Ur S NONE DETECTED NONE DETECTED   Cannabinoid 50 Ng, Ur [place] POSITIVE (A) NONE DETECTED   Barbiturates, Ur Screen NONE DETECTED NONE DETECTED   Benzodiazepine, Ur Scrn POSITIVE (A) NONE DETECTED   Methadone Scn, Ur POSITIVE (A) NONE DETECTED    Comment: (NOTE) 419  Tricyclics, urine               Cutoff 1000 ng/mL  200  Amphetamines, urine             Cutoff 1000 ng/mL 300  MDMA (Ecstasy), urine           Cutoff 500 ng/mL 400  Cocaine Metabolite, urine       Cutoff 300 ng/mL 500  Opiate, urine                   Cutoff 300 ng/mL 600  Phencyclidine (PCP), urine      Cutoff 25 ng/mL 700  Cannabinoid, urine              Cutoff 50 ng/mL 800  Barbiturates,  urine             Cutoff 200 ng/mL 900  Benzodiazepine, urine           Cutoff 200 ng/mL 1000 Methadone, urine                Cutoff 300 ng/mL 1100 1200 The urine drug screen provides only a preliminary, unconfirmed 1300 analytical test result and should not be used for non-medical 1400 purposes. Clinical consideration and professional judgment should 1500 be applied to any positive drug screen result due to possible 1600 interfering substances. A more specific alternate chemical method 1700 must be used in order to obtain a confirmed analytical result.  1800 Gas chromato graphy / mass spectrometry (GC/MS) is the preferred 1900 confirmatory method.   Urinalysis complete, with microscopic ([facility] only)     Status: Abnormal   Collection Time: 12/20/14  3:33 AM  Result Value Ref Range   Color, Urine YELLOW (A) YELLOW   APPearance TURBID (A) CLEAR   Glucose, UA NEGATIVE NEGATIVE mg/dL   Bilirubin Urine NEGATIVE NEGATIVE   Ketones, ur NEGATIVE NEGATIVE mg/dL   Specific Gravity, Urine 1.026 1.005 - 1.030   Hgb urine dipstick NEGATIVE NEGATIVE   pH 5.0 5.0 - 8.0   Protein, ur NEGATIVE NEGATIVE mg/dL   Nitrite NEGATIVE NEGATIVE   Leukocytes, UA NEGATIVE NEGATIVE   RBC / HPF NONE SEEN 0 - 5 RBC/hpf   WBC, UA 6-30 0 - 5 WBC/hpf   Bacteria, UA NONE SEEN NONE SEEN   Squamous Epithelial / LPF NONE SEEN NONE SEEN   Mucous PRESENT    Amorphous Crystal PRESENT     Vitals: Blood pressure 107/76, pulse 84, resp. rate 20, weight 56.7 kg (125 lb), SpO2 99 %.  Risk to Self: Suicidal Ideation: No Suicidal Intent: No Is patient at risk for suicide?: No Suicidal Plan?: No Access to Means: No What has been your use of drugs/alcohol within the last 12 months?:  (Pt refused to provide information) How many times?:  (Unknown) Triggers for Past Attempts: None known Intentional Self Injurious Behavior: Cutting Comment - Self Injurious Behavior:  (Pt reports cuts on forearm are from "3 years  ago") Risk to Others: Homicidal Ideation: No Thoughts of Harm to Others: No Current Homicidal Intent: No Current Homicidal Plan: No Access to Homicidal Means: No Identified Victim: N/A History of harm to others?: No Assessment of Violence: None Noted Violent Behavior Description: N/A Does patient have access to weapons?: No Criminal Charges Pending?: Yes Describe Pending Criminal Charges:  Solicitor larceny) Does patient have a court date: Yes Court Date:  (December 29, 2014; January 22, 2015 ) Prior Inpatient Therapy: Prior Inpatient Therapy: No Prior Therapy Dates: N/A Prior Therapy Facilty/Provider(s): N/A Reason for Treatment: N/A Prior Outpatient Therapy: Prior Outpatient Therapy: No Prior Therapy Dates: N/A Prior Therapy  Facilty/Provider(s): N/A Reason for Treatment: N/A Does patient have an ACCT team?: No Does patient have Intensive In-House Services?  : No Does patient have [facility] services? : No Does patient have P4CC services?: No  No current facility-administered medications for this encounter.   Current Outpatient Prescriptions  Medication Sig Dispense Refill  . venlafaxine (EFFEXOR) 37.5 MG tablet Take 37.5 mg by mouth.       Musculoskeletal: Strength & Muscle Tone: within normal limits Gait & Station: normal Patient leans: N/A  Psychiatric Specialty Exam: Physical Exam  Review of Systems  Constitutional: Negative.   HENT: Negative.   Eyes: Negative.   Respiratory: Negative.   Cardiovascular: Negative.   Gastrointestinal: Negative.   Genitourinary: Negative.   Musculoskeletal: Negative.   Skin: Negative.   Neurological: Negative.   Endo/Heme/Allergies: Negative.   Psychiatric/Behavioral: Positive for depression and substance abuse. The patient is nervous/anxious.     Blood pressure 107/76, pulse 84, resp. rate 20, weight 56.7 kg (125 lb), SpO2 99 %.Body mass index is 22.86 kg/(m^2).  General Appearance: Casual  Eye Contact::  Fair  Speech:  Clear  and Coherent  Volume:  Normal  Mood:  Anxious  Affect:  Appropriate  Thought Process:  Circumstantial  Orientation:  Full (Time, Place, and Person)  Thought Content:  WDL  Suicidal Thoughts:  No  Homicidal Thoughts:  No  Memory:  Immediate;   Fair Recent;   Fair Remote;   Fair adequate  Judgement:  Fair  Insight:  Fair  Psychomotor Activity:  Normal  Concentration:  Fair  Recall:  [employer] of Knowledge:Fair  Language: Fair  Akathisia:  No  Handed:  Right  AIMS (if indicated):     Assets:  Communication Skills Desire for Improvement Physical Health Talents/Skills  ADL's:  Intact  Cognition: WNL  Sleep:      Medical Decision Making: Established Problem, Stable/Improving (1)  Treatment Plan Summary: Plan D/C IVC and discharge pt home and will keep follow up [facility] and at [facility] clinic. adn SS will contact CPS abput her 77 yr old son.  Plan:  No evidence of imminent risk to self or others at present.   Disposition: as above  Dewain Penning 12/20/2014 6:32 PM

## 2014-12-20 NOTE — ED Notes (Signed)
BEHAVIORAL HEALTH ROUNDING Patient sleeping: Yes.   Patient alert and oriented: yes Behavior appropriate: Yes.  ; If no, describe:  Nutrition and fluids offered: Yes  Toileting and hygiene offered: Yes  Sitter present: no Law enforcement present: Yes  

## 2014-12-20 NOTE — ED Notes (Signed)
BEHAVIORAL HEALTH ROUNDING Patient sleeping: No. Patient alert and oriented: yes Behavior appropriate: No.; If no, describe: Patient getting upset and not understanding why she is here.  Patient was able to be redirected and calmed down. Nutrition and fluids offered: Yes  Toileting and hygiene offered: Yes  Sitter present: 15 minute safety checks Law enforcement present: Engineer, materialssecurity officer present

## 2014-12-20 NOTE — ED Notes (Signed)
BEHAVIORAL HEALTH ROUNDING Patient sleeping: No. Patient alert and oriented: yes Behavior appropriate: Yes.  ; If no, describe:  Nutrition and fluids offered: Yes  Toileting and hygiene offered: Yes  Sitter present: yes Law enforcement present: Yes  

## 2015-07-17 ENCOUNTER — Encounter: Payer: Self-pay | Admitting: Emergency Medicine

## 2015-07-17 ENCOUNTER — Emergency Department
Admission: EM | Admit: 2015-07-17 | Discharge: 2015-07-17 | Disposition: A | Discharge status: Home / Self Care | Payer: Self-pay | Attending: Emergency Medicine | Admitting: Emergency Medicine

## 2015-07-17 DIAGNOSIS — F1110 Opioid abuse, uncomplicated: Secondary | ICD-10-CM | POA: Insufficient documentation

## 2015-07-17 DIAGNOSIS — Y998 Other external cause status: Secondary | ICD-10-CM | POA: Insufficient documentation

## 2015-07-17 DIAGNOSIS — Y9389 Activity, other specified: Secondary | ICD-10-CM | POA: Insufficient documentation

## 2015-07-17 DIAGNOSIS — F1210 Cannabis abuse, uncomplicated: Secondary | ICD-10-CM | POA: Insufficient documentation

## 2015-07-17 DIAGNOSIS — Z3202 Encounter for pregnancy test, result negative: Secondary | ICD-10-CM | POA: Insufficient documentation

## 2015-07-17 DIAGNOSIS — X58XXXA Exposure to other specified factors, initial encounter: Secondary | ICD-10-CM | POA: Insufficient documentation

## 2015-07-17 DIAGNOSIS — R569 Unspecified convulsions: Secondary | ICD-10-CM | POA: Insufficient documentation

## 2015-07-17 DIAGNOSIS — S00532A Contusion of oral cavity, initial encounter: Secondary | ICD-10-CM | POA: Insufficient documentation

## 2015-07-17 DIAGNOSIS — F17200 Nicotine dependence, unspecified, uncomplicated: Secondary | ICD-10-CM | POA: Insufficient documentation

## 2015-07-17 DIAGNOSIS — F1310 Sedative, hypnotic or anxiolytic abuse, uncomplicated: Secondary | ICD-10-CM | POA: Insufficient documentation

## 2015-07-17 DIAGNOSIS — Y9289 Other specified places as the place of occurrence of the external cause: Secondary | ICD-10-CM | POA: Insufficient documentation

## 2015-07-17 LAB — URINALYSIS COMPLETE WITH MICROSCOPIC (ARMC ONLY)
Bilirubin Urine: NEGATIVE
Glucose, UA: NEGATIVE mg/dL
KETONES UR: NEGATIVE mg/dL
Nitrite: NEGATIVE
PH: 5 (ref 5.0–8.0)
PROTEIN: 30 mg/dL — AB
Specific Gravity, Urine: 1.026 (ref 1.005–1.030)

## 2015-07-17 LAB — CBC
HCT: 38 % (ref 35.0–47.0)
Hemoglobin: 12.7 g/dL (ref 12.0–16.0)
MCH: 29.6 pg (ref 26.0–34.0)
MCHC: 33.5 g/dL (ref 32.0–36.0)
MCV: 88.2 fL (ref 80.0–100.0)
PLATELETS: 227 10*3/uL (ref 150–440)
RBC: 4.31 MIL/uL (ref 3.80–5.20)
RDW: 13.2 % (ref 11.5–14.5)
WBC: 9.9 10*3/uL (ref 3.6–11.0)

## 2015-07-17 LAB — GLUCOSE, CAPILLARY: GLUCOSE-CAPILLARY: 111 mg/dL — AB (ref 65–99)

## 2015-07-17 LAB — URINE DRUG SCREEN, QUALITATIVE (ARMC ONLY)
Amphetamines, Ur Screen: NOT DETECTED
Barbiturates, Ur Screen: NOT DETECTED
Benzodiazepine, Ur Scrn: POSITIVE — AB
CANNABINOID 50 NG, UR ~~LOC~~: POSITIVE — AB
Cocaine Metabolite,Ur ~~LOC~~: NOT DETECTED
MDMA (ECSTASY) UR SCREEN: NOT DETECTED
Methadone Scn, Ur: NOT DETECTED
OPIATE, UR SCREEN: POSITIVE — AB
PHENCYCLIDINE (PCP) UR S: NOT DETECTED
Tricyclic, Ur Screen: NOT DETECTED

## 2015-07-17 LAB — POCT PREGNANCY, URINE: PREG TEST UR: NEGATIVE

## 2015-07-17 NOTE — ED Provider Notes (Signed)
[facility] Emergency Department Provider Note    ____________________________________________  Time seen: ~1705  I have reviewed the triage vital signs and the nursing notes.   HISTORY  Chief Complaint Seizure  History limited by: Not Limited   HPI Kristine Griffin is a 25 y.o. female who presents to the emergency department today via EMS after seizure-like episode. Patient states that the last thing she remembers is finishing up a long hike. She started laughing and then she had a seizure. She states that her fianc said that her eyes rolled back and she had full body shaking. She was not incontinent of urine. She is unsure how long it lasted. She denies any recent alcohol use. States she is on Suboxone but denies any other drug use. States she did not sleep at all last night. She thinks she might of had one seizure in the past when she was abusing opiates. Otherwise patient states she has been feeling fine recently no fevers, nausea vomiting or shortness of breath.     Past Medical History  Diagnosis Date  . Hip dislocation, right     x2    Patient Active Problem List   Diagnosis Date Noted  . Opiate abuse, episodic 10/27/2014  . Cocaine abuse 10/27/2014  . Hip pain 10/27/2014  . Depression 10/27/2014    No past surgical history on file.  Current Outpatient Rx  Name  Route  Sig  Dispense  Refill  . venlafaxine (EFFEXOR) 37.5 MG tablet   Oral   Take 37.5 mg by mouth.            Allergies Review of patient's allergies indicates no known allergies.  No family history on file.  Social History Social History  Substance Use Topics  . Smoking status: Current Every Day Smoker  . Smokeless tobacco: Never Used  . Alcohol Use: Yes    Review of Systems  Constitutional: Negative for fever. Cardiovascular: Negative for chest pain. Respiratory: Negative for shortness of breath. Gastrointestinal: Negative for abdominal pain, vomiting and  diarrhea. Neurological: Negative for headaches, focal weakness or numbness.  10-point ROS otherwise negative.  ____________________________________________   PHYSICAL EXAM:  VITAL SIGNS:   107  15  129/80 mmHg  100 %     Constitutional: Alert and oriented. Well appearing and in no distress. Eyes: Conjunctivae are normal. PERRL. Normal extraocular movements. ENT   Head: Normocephalic and atraumatic.   Nose: No congestion/rhinnorhea.   Mouth/Throat: Mucous membranes are moist. Patient has small bruising to the left side of tongue.   Neck: No stridor. Hematological/Lymphatic/Immunilogical: No cervical lymphadenopathy. Cardiovascular: Normal rate, regular rhythm.  No murmurs, rubs, or gallops. Respiratory: Normal respiratory effort without tachypnea nor retractions. Breath sounds are clear and equal bilaterally. No wheezes/rales/rhonchi. Gastrointestinal: Soft and nontender. No distention.  Genitourinary: Deferred Musculoskeletal: Normal range of motion in all extremities. No joint effusions.  No lower extremity tenderness nor edema. Neurologic:  Normal speech and language. No gross focal neurologic deficits are appreciated.  Skin:  Skin is warm, dry and intact. No rash noted. Psychiatric: Mood and affect are normal. Speech and behavior are normal. Patient exhibits appropriate insight and judgment.  ____________________________________________    LABS (pertinent positives/negatives)  Labs Reviewed  URINE DRUG SCREEN, QUALITATIVE ([facility] ONLY) - Abnormal; Notable for the following:    Opiate, Ur Screen POSITIVE (*)    Cannabinoid 50 Ng, Ur [place] POSITIVE (*)    Benzodiazepine, Ur Scrn POSITIVE (*)    All other components within  normal limits  URINALYSIS COMPLETEWITH MICROSCOPIC ([facility] ONLY) - Abnormal; Notable for the following:    Color, Urine YELLOW (*)    APPearance CLOUDY (*)    Hgb urine dipstick 3+ (*)    Protein, ur 30 (*)    Leukocytes, UA 2+ (*)     Bacteria, UA FEW (*)    Squamous Epithelial / LPF 6-30 (*)    All other components within normal limits  GLUCOSE, CAPILLARY - Abnormal; Notable for the following:    Glucose-Capillary 111 (*)    All other components within normal limits  CBC  CBG MONITORING, ED  POCT PREGNANCY, URINE     ____________________________________________   EKG  I, Phineas Semen, attending physician, personally viewed and interpreted this EKG  EKG Time: 1653 Rate: 104 Rhythm: sinus tachycardia Axis: normal Intervals: qtc 450 QRS: narrow ST changes: no st elevation Impression: sinus tachycardia otherwise normal ekg  ____________________________________________    RADIOLOGY  None   ____________________________________________   PROCEDURES  Procedure(s) performed: None  Critical Care performed: No  ____________________________________________   INITIAL IMPRESSION / ASSESSMENT AND PLAN / ED COURSE  Pertinent labs & imaging results that were available during my care of the patient were reviewed by me and considered in my medical decision making (see chart for details).  Patient presented to the emergency department today after an apparent seizure like episode. Patient does state that she got no sleep last night. Patient without any focal neuro deficits on my exam. Patient back to baseline. Additionally patient's urine drug screen positive for multiple substances. Given a single episode and potential provoking factors will not place on any antibiotics at this time. I did discuss with the patient importance of seizure precautions and stated that she should not drive, swim, go up on roofs or engage in any other dangerous activities until cleared by neurologist. I will give the patient neurology follow-up.  ____________________________________________   FINAL CLINICAL IMPRESSION(S) / ED DIAGNOSES  Final diagnoses:  Seizure-like activity (HCC)     Phineas Semen, MD 07/17/15 1927

## 2015-07-17 NOTE — ED Notes (Signed)
Pt arrived by [facility] after having a seizure witnessed by her Fiance. Per EMS fiance states it lasted only a "short time". Pt is A&O x4.

## 2015-07-17 NOTE — Discharge Instructions (Signed)
As we discussed it is very important that you do not drive, go up on roofs, swim, or put yourself in any situation that might be dangerous for you or others if you were to have another seizure until you are cleared by a neurologist. Please seek medical attention for any high fevers, chest pain, shortness of breath, change in behavior, persistent vomiting, bloody stool or any other new or concerning symptoms.   Seizure, Adult A seizure means there is unusual activity in the brain. A seizure can cause changes in attention or behavior. Seizures often cause shaking (convulsions). Seizures often last from 30 seconds to 2 minutes. HOME CARE   If you are given medicines, take them exactly as told by your doctor.  Keep all doctor visits as told.  Do not swim or drive until your doctor says it is okay.  Teach others what to do if you have a seizure. They should:  Lay you on the ground.  Put a cushion under your head.  Loosen any tight clothing around your neck.  Turn you on your side.  Stay with you until you get better. GET HELP RIGHT AWAY IF:   The seizure lasts longer than 2 to 5 minutes.  The seizure is very bad.  The person does not wake up after the seizure.  The person's attention or behavior changes. Drive the person to the emergency room or call your local emergency services (911 in [place] MAKE SURE YOU:   Understand these instructions.  Will watch your condition.  Will get help right away if you are not doing well or get worse.   This information is not intended to replace advice given to you by your health care provider. Make sure you discuss any questions you have with your health care provider.   Document Released: 11/08/2007 Document Revised: 08/14/2011 Document Reviewed: 01/01/2013 Elsevier Interactive Patient Education [employer]

## 2015-07-17 NOTE — ED Notes (Signed)
Pt verbalized understanding of discharge instructions. NAD at this time. 

## 2016-02-10 ENCOUNTER — Encounter: Payer: Self-pay | Admitting: *Deleted

## 2016-02-10 ENCOUNTER — Observation Stay
Admission: EM | Admit: 2016-02-10 | Discharge: 2016-02-11 | Disposition: A | Discharge status: Home / Self Care | Payer: Medicaid Other | Attending: Internal Medicine | Admitting: Internal Medicine

## 2016-02-10 DIAGNOSIS — F329 Major depressive disorder, single episode, unspecified: Secondary | ICD-10-CM | POA: Insufficient documentation

## 2016-02-10 DIAGNOSIS — F1910 Other psychoactive substance abuse, uncomplicated: Secondary | ICD-10-CM

## 2016-02-10 DIAGNOSIS — F17200 Nicotine dependence, unspecified, uncomplicated: Secondary | ICD-10-CM | POA: Insufficient documentation

## 2016-02-10 DIAGNOSIS — T48201A Poisoning by unspecified drugs acting on muscles, accidental (unintentional), initial encounter: Secondary | ICD-10-CM | POA: Diagnosis present

## 2016-02-10 DIAGNOSIS — Z79899 Other long term (current) drug therapy: Secondary | ICD-10-CM | POA: Insufficient documentation

## 2016-02-10 DIAGNOSIS — F1110 Opioid abuse, uncomplicated: Secondary | ICD-10-CM

## 2016-02-10 DIAGNOSIS — T426X2A Poisoning by other antiepileptic and sedative-hypnotic drugs, intentional self-harm, initial encounter: Principal | ICD-10-CM | POA: Insufficient documentation

## 2016-02-10 DIAGNOSIS — F1310 Sedative, hypnotic or anxiolytic abuse, uncomplicated: Secondary | ICD-10-CM | POA: Insufficient documentation

## 2016-02-10 DIAGNOSIS — F1410 Cocaine abuse, uncomplicated: Secondary | ICD-10-CM | POA: Insufficient documentation

## 2016-02-10 DIAGNOSIS — T50902A Poisoning by unspecified drugs, medicaments and biological substances, intentional self-harm, initial encounter: Secondary | ICD-10-CM

## 2016-02-10 LAB — CBC
HEMATOCRIT: 40.1 % (ref 35.0–47.0)
HEMOGLOBIN: 13.8 g/dL (ref 12.0–16.0)
MCH: 30.7 pg (ref 26.0–34.0)
MCHC: 34.5 g/dL (ref 32.0–36.0)
MCV: 89.2 fL (ref 80.0–100.0)
Platelets: 196 10*3/uL (ref 150–440)
RBC: 4.49 MIL/uL (ref 3.80–5.20)
RDW: 12.9 % (ref 11.5–14.5)
WBC: 7.6 10*3/uL (ref 3.6–11.0)

## 2016-02-10 LAB — COMPREHENSIVE METABOLIC PANEL
ALBUMIN: 4.2 g/dL (ref 3.5–5.0)
ALK PHOS: 48 U/L (ref 38–126)
ALT: 16 U/L (ref 14–54)
ANION GAP: 6 (ref 5–15)
AST: 19 U/L (ref 15–41)
BILIRUBIN TOTAL: 0.4 mg/dL (ref 0.3–1.2)
BUN: 13 mg/dL (ref 6–20)
CALCIUM: 9 mg/dL (ref 8.9–10.3)
CO2: 24 mmol/L (ref 22–32)
CREATININE: 0.71 mg/dL (ref 0.44–1.00)
Chloride: 110 mmol/L (ref 101–111)
GFR calc Af Amer: >60 mL/min (ref >60)
GFR calc non Af Amer: >60 mL/min (ref >60)
GLUCOSE: 65 mg/dL (ref 65–99)
Potassium: 4.3 mmol/L (ref 3.5–5.1)
Sodium: 140 mmol/L (ref 135–145)
TOTAL PROTEIN: 6.9 g/dL (ref 6.5–8.1)

## 2016-02-10 LAB — TSH: TSH: 1.015 u[IU]/mL (ref 0.350–4.500)

## 2016-02-10 LAB — SALICYLATE LEVEL: Salicylate Lvl: <4 mg/dL (ref 2.8–30.0)

## 2016-02-10 LAB — URINE DRUG SCREEN, QUALITATIVE (ARMC ONLY)
Amphetamines, Ur Screen: NOT DETECTED
BARBITURATES, UR SCREEN: NOT DETECTED
BENZODIAZEPINE, UR SCRN: POSITIVE — AB
CANNABINOID 50 NG, UR ~~LOC~~: NOT DETECTED
COCAINE METABOLITE, UR ~~LOC~~: POSITIVE — AB
MDMA (Ecstasy)Ur Screen: NOT DETECTED
Methadone Scn, Ur: NOT DETECTED
OPIATE, UR SCREEN: NOT DETECTED
PHENCYCLIDINE (PCP) UR S: NOT DETECTED
Tricyclic, Ur Screen: NOT DETECTED

## 2016-02-10 LAB — HEMOGLOBIN A1C: Hgb A1c MFr Bld: 5.4 % (ref 4.0–6.0)

## 2016-02-10 LAB — ETHANOL: Alcohol, Ethyl (B): <5 mg/dL (ref <5)

## 2016-02-10 LAB — ACETAMINOPHEN LEVEL

## 2016-02-10 LAB — POCT PREGNANCY, URINE: Preg Test, Ur: NEGATIVE

## 2016-02-10 MED ORDER — ACETAMINOPHEN 325 MG PO TABS: 650.0000 mg | ORAL_TABLET | Freq: Four times a day (QID) | ORAL | Status: DC | PRN

## 2016-02-10 MED ORDER — ACETAMINOPHEN 650 MG RE SUPP: 650.0000 mg | Freq: Four times a day (QID) | RECTAL | Status: DC | PRN

## 2016-02-10 MED ORDER — SODIUM CHLORIDE 0.9 % IV SOLN
INTRAVENOUS | Status: DC
  Administered 2016-02-10: 05:00:00 via INTRAVENOUS

## 2016-02-10 MED ORDER — DOCUSATE SODIUM 100 MG PO CAPS
100.0000 mg | ORAL_CAPSULE | Freq: Two times a day (BID) | ORAL | Status: DC
  Administered 2016-02-10: 100 mg via ORAL
  Filled 2016-02-10: qty 1

## 2016-02-10 MED ORDER — BUPRENORPHINE HCL 8 MG SL SUBL
8.0000 mg | SUBLINGUAL_TABLET | Freq: Every day | SUBLINGUAL | Status: DC
  Administered 2016-02-10: 8 mg via SUBLINGUAL
  Filled 2016-02-10: qty 1

## 2016-02-10 MED ORDER — SODIUM CHLORIDE 0.9 % IV BOLUS (SEPSIS)
1000.0000 mL | Freq: Once | INTRAVENOUS | Status: AC
  Administered 2016-02-10: 1000 mL via INTRAVENOUS

## 2016-02-10 MED ORDER — HYDROCODONE-ACETAMINOPHEN 5-325 MG PO TABS: 1.0000 | ORAL_TABLET | ORAL | Status: DC | PRN

## 2016-02-10 MED ORDER — ONDANSETRON HCL 4 MG PO TABS: 4.0000 mg | ORAL_TABLET | Freq: Four times a day (QID) | ORAL | Status: DC | PRN

## 2016-02-10 MED ORDER — SUCCINYLCHOLINE CHLORIDE 20 MG/ML IJ SOLN: 120.0000 mg | Freq: Once | INTRAMUSCULAR | Status: DC

## 2016-02-10 MED ORDER — ENOXAPARIN SODIUM 40 MG/0.4ML ~~LOC~~ SOLN
40.0000 mg | SUBCUTANEOUS | Status: DC
  Filled 2016-02-10: qty 0.4

## 2016-02-10 MED ORDER — SODIUM CHLORIDE 0.9% FLUSH: 3.0000 mL | Freq: Two times a day (BID) | INTRAVENOUS | Status: DC

## 2016-02-10 MED ORDER — ONDANSETRON HCL 4 MG/2ML IJ SOLN: 4.0000 mg | Freq: Four times a day (QID) | INTRAMUSCULAR | Status: DC | PRN

## 2016-02-10 MED ORDER — ETOMIDATE 2 MG/ML IV SOLN: 20.0000 mg | Freq: Once | INTRAVENOUS | Status: DC

## 2016-02-10 NOTE — Progress Notes (Signed)
Patient complained of irritation at her urinary catheter. MD notified. Catheter D/C.  Harvie HeckMelanie Jamelle Goldston, RN

## 2016-02-10 NOTE — ED Notes (Signed)
Pt cont to be minimally responsive.  Moving voluntarily at times all extremeties, but not following commands.  Pupils 4mm and not responsive to light.  VSS.

## 2016-02-10 NOTE — ED Triage Notes (Addendum)
Pt brought in via ems from a car in [place] pt was found sitting in the front seat unresponsive by ems.   Pt unresponsive on arrival to er.  Iv in place.  md at bedside.

## 2016-02-10 NOTE — Clinical Social Work Note (Signed)
Clinical Social Work Assessment  Patient Details  Name: Kristine Griffin MRN: [id number] Date of Birth: 19-Apr-1991  Date of referral:  02/10/16               Reason for consult:  Substance Use/ETOH Abuse                Permission sought to share information with:    Permission granted to share information::     Name::        Agency::     Relationship::     Contact Information:     Housing/Transportation Living arrangements for the past 2 months:  Single Family Home Source of Information:  Patient Patient Interpreter Needed:  None Criminal Activity/Legal Involvement Pertinent to Current Situation/Hospitalization:  No - Comment as needed Significant Relationships:  Other Family Members Lives with:  Self Do you feel safe going back to the place where you live?  Yes Need for family participation in patient care:  No (Coment)  Care giving concerns:  Patient lives in [place].    Social Worker assessment / plan:  Holiday representative (CSW) received consult for substance abuse. Patient's drug screen is positive for cocaine and benzodiazepines. CSW met with patient who had a sitter at the bedside. CSW introduced self and explained role of CSW department. Patient was alert and oriented and sitting up in the bed. Patient did appear drowsy. Patient reported that she lives in [place] and has a 9 year old son. Per patient she sees her son on the weekends and he lives with his father. Patient reported that she goes to [facility] and it has changed her life for the better. Patient reported that she goes to groups every week and takes suboxone. Patient became tearful when CSW asked about cocaine use. Patient reported that she has been clean for a year except for the night she come into the hospital. Patient reported that she was at a friends house and put cocaine on her gums because her friends told her it would make them numb. Patient reported that she regrets doing it and has not used  drugs in a year. Patient reported that she took 5 muscle relaxers at her aunts house because her back was hurting and she woke up in the hospital. Patient denied suicidial ideations and reported that this was an accident. CSW provided emotional support. Patient reported no other needs or concerns at this time. CSW will continue to follow and assist as needed.         Employment status:  [employer] information:  Self Pay (Medicaid Pending) PT Recommendations:  Not assessed at this time Information / Referral to community resources:  Outpatient Substance Abuse Treatment Options  Patient/Family's Response to care:  Patient is already going to [facility] and plans to continue treatment there once she leaves [facility].   Patient/Family's Understanding of and Emotional Response to Diagnosis, Current Treatment, and Prognosis:  Patient was tearful and reported that she regrets using. Patient provided emotional support.   Emotional Assessment Appearance:  Appears stated age Attitude/Demeanor/Rapport:    Affect (typically observed):  Pleasant, Tearful/Crying Orientation:  Oriented to Self, Oriented to Place, Oriented to  Time, Oriented to Situation Alcohol / Substance use:  Illicit Drugs Psych involvement (Current and /or in the community):  Yes (Comment) (Psych Consult Pending )  Discharge Needs  Concerns to be addressed:  Discharge Planning Concerns Readmission within the last 30 days:  No Current discharge risk:  Substance Abuse Barriers  to Discharge:  Continued Medical Work up   [employer], Veronia Beets, LCSW 02/10/2016, 3:24 PM

## 2016-02-10 NOTE — ED Notes (Signed)
md in with pt again.  Sitter at bedside.  nsr on monitor.  2 iv's in place.  Foley in place with approx 500 cc yellow urine.

## 2016-02-10 NOTE — ED Notes (Signed)
Pt transported to 1A room 153 via stretcher by Maralyn SagoSarah NT.

## 2016-02-10 NOTE — Consult Note (Signed)
[facility] Consult   Reason for Consult:  Consult for 25 year old woman brought into the hospital unconscious Referring Physician:  Posey Pronto Patient Identification: Kristine Griffin MRN:  [id number] Principal Diagnosis: Sedative abuse Diagnosis:   Patient Active Problem List   Diagnosis Date Noted  . Overdose of muscle relaxant [T48.201A] 02/10/2016  . Opiate abuse, continuous [F11.10] 02/10/2016  . 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 Opiate abuse, episodic [F11.10] 10/27/2014  . Cocaine abuse [F14.10] 10/27/2014  . Hip pain [M25.559] 10/27/2014  . Depression [F32.9] 10/27/2014    Total Time spent with patient: 1 hour  Subjective:   Kristine Griffin is a 25 y.o. female patient admitted with "I feel so stupid".  HPI:  Patient interviewed. Chart reviewed. Labs and vitals reviewed. Patient known also from prior encounters. 25 year old woman brought to the emergency room unresponsive apparently after taking some combination of pills. Patient did not require intubation and has gradually woken up on her own. On interview today the patient says the last thing she remembers was that she was having trouble sleeping last night. She ask her aunt Kristine Griffin for some help and her aunt Kristine Griffin gave her some pills which were supposedly gabapentin and a muscle relaxer. Patient denies that she took any other medicine but does admit that there were 5 or 6 of each pill in the handful that she took. She denies that she had used any extra opiates. She denies that she had used any Xanax or Ativan. She says that she has been compliant with her Suboxone. Patient absolutely denies any suicidal intent whatsoever. She says her mood has been good. Doesn't feel depressed at all. Feels upbeat and hopeful. Still has chronic trouble sleeping. No hallucinations no psychotic symptoms.  Social history: Patient says she lives with her uncle. Works as a Educational psychologist. She goes to some length to impress on me how much  she loves her life.  Medical history: Patient has a history of orthopedic pain for which she was once prescribed quite a bit of narcotic this beginning her on her [place] of opiate dependence.  Substance abuse history: Patient has a history of opiate dependence and is now maintained on Suboxone. Also history of abuse of cocaine and other drugs. She admits that she used some cocaine claims it was just one time 2 days ago.  Past Psychiatric History: This is at least the third time she's been in the hospital for accidental overdoses. Prior to that she has a history of self-mutilation. Asian plays down the significance of all of this. She says she is not currently taking any medicine for depression or anxiety. She denies that she's ever tried to kill her self.  Risk to Self: Is patient at risk for suicide?: No Risk to Others:   Prior Inpatient Therapy:   Prior Outpatient Therapy:    Past Medical History:  Past Medical History:  Diagnosis Date  . Hip dislocation, right ([place])    x2   No past surgical history on file. Family History: No family history on file. Family Psychiatric  History: Family history positive for substance abuse and mood instability Social History:  History  Alcohol Use  . Yes     History  Drug Use    Comment: H/o drug treatment    Social History   Social History  . Marital status: Single    Spouse name: N/A  . Number of children: N/A  . Years of education: N/A   Social History Main Topics  .  Smoking status: Current Every Day Smoker  . Smokeless tobacco: Never Used  . Alcohol use Yes  . Drug use:      Comment: H/o drug treatment  . Sexual activity: Not Asked   Other Topics Concern  . None   Social History Narrative  . None   Additional Social History:    Allergies:  No Known Allergies  Labs:  Results for orders placed or performed during the hospital encounter of 02/10/16 (from the past 48 hour(s))  Comprehensive metabolic panel     Status: None    Collection Time: 02/10/16  1:24 AM  Result Value Ref Range   Sodium 140 135 - 145 mmol/L   Potassium 4.3 3.5 - 5.1 mmol/L   Chloride 110 101 - 111 mmol/L   CO2 24 22 - 32 mmol/L   Glucose, Bld 65 65 - 99 mg/dL   BUN 13 6 - 20 mg/dL   Creatinine, Ser 0.71 0.44 - 1.00 mg/dL   Calcium 9.0 8.9 - 10.3 mg/dL   Total Protein 6.9 6.5 - 8.1 g/dL   Albumin 4.2 3.5 - 5.0 g/dL   AST 19 15 - 41 U/L   ALT 16 14 - 54 U/L   Alkaline Phosphatase 48 38 - 126 U/L   Total Bilirubin 0.4 0.3 - 1.2 mg/dL   GFR calc non Af Amer >60 >60 mL/min   GFR calc Af Amer >60 >60 mL/min    Comment: (NOTE) The eGFR has been calculated using the CKD EPI equation. This calculation has not been validated in all clinical situations. eGFR's persistently <60 mL/min signify possible Chronic Kidney Disease.    Anion gap 6 5 - 15  Ethanol     Status: None   Collection Time: 02/10/16  1:24 AM  Result Value Ref Range   Alcohol, Ethyl (B) <5 <5 mg/dL    Comment:        LOWEST DETECTABLE LIMIT FOR SERUM ALCOHOL IS 5 mg/dL FOR MEDICAL PURPOSES ONLY   Salicylate level     Status: None   Collection Time: 02/10/16  1:24 AM  Result Value Ref Range   Salicylate Lvl <6.1 2.8 - 30.0 mg/dL  Acetaminophen level     Status: Abnormal   Collection Time: 02/10/16  1:24 AM  Result Value Ref Range   Acetaminophen (Tylenol), Serum <10 (L) 10 - 30 ug/mL    Comment:        THERAPEUTIC CONCENTRATIONS VARY SIGNIFICANTLY. A RANGE OF 10-30 ug/mL MAY BE AN EFFECTIVE CONCENTRATION FOR MANY PATIENTS. HOWEVER, SOME ARE BEST TREATED AT CONCENTRATIONS OUTSIDE THIS RANGE. ACETAMINOPHEN CONCENTRATIONS >150 ug/mL AT 4 HOURS AFTER INGESTION AND >50 ug/mL AT 12 HOURS AFTER INGESTION ARE OFTEN ASSOCIATED WITH TOXIC REACTIONS.   cbc     Status: None   Collection Time: 02/10/16  1:24 AM  Result Value Ref Range   WBC 7.6 3.6 - 11.0 K/uL   RBC 4.49 3.80 - 5.20 MIL/uL   Hemoglobin 13.8 12.0 - 16.0 g/dL   HCT 40.1 35.0 - 47.0 %   MCV 89.2  80.0 - 100.0 fL   MCH 30.7 26.0 - 34.0 pg   MCHC 34.5 32.0 - 36.0 g/dL   RDW 12.9 11.5 - 14.5 %   Platelets 196 150 - 440 K/uL  TSH     Status: None   Collection Time: 02/10/16  1:24 AM  Result Value Ref Range   TSH 1.015 0.350 - 4.500 uIU/mL  Hemoglobin A1c     Status: None  Collection Time: 02/10/16  1:24 AM  Result Value Ref Range   Hgb A1c MFr Bld 5.4 4.0 - 6.0 %  Urine Drug Screen, Qualitative     Status: Abnormal   Collection Time: 02/10/16  1:45 AM  Result Value Ref Range   Tricyclic, Ur Screen NONE DETECTED NONE DETECTED   Amphetamines, Ur Screen NONE DETECTED NONE DETECTED   MDMA (Ecstasy)Ur Screen NONE DETECTED NONE DETECTED   Cocaine Metabolite,Ur [place] POSITIVE (A) NONE DETECTED   Opiate, Ur Screen NONE DETECTED NONE DETECTED   Phencyclidine (PCP) Ur S NONE DETECTED NONE DETECTED   Cannabinoid 50 Ng, Ur  NONE DETECTED NONE DETECTED   Barbiturates, Ur Screen NONE DETECTED NONE DETECTED   Benzodiazepine, Ur Scrn POSITIVE (A) NONE DETECTED   Methadone Scn, Ur NONE DETECTED NONE DETECTED    Comment: (NOTE) 034  Tricyclics, urine               Cutoff 1000 ng/mL 200  Amphetamines, urine             Cutoff 1000 ng/mL 300  MDMA (Ecstasy), urine           Cutoff 500 ng/mL 400  Cocaine Metabolite, urine       Cutoff 300 ng/mL 500  Opiate, urine                   Cutoff 300 ng/mL 600  Phencyclidine (PCP), urine      Cutoff 25 ng/mL 700  Cannabinoid, urine              Cutoff 50 ng/mL 800  Barbiturates, urine             Cutoff 200 ng/mL 900  Benzodiazepine, urine           Cutoff 200 ng/mL 1000 Methadone, urine                Cutoff 300 ng/mL 1100 1200 The urine drug screen provides only a preliminary, unconfirmed 1300 analytical test result and should not be used for non-medical 1400 purposes. Clinical consideration and professional judgment should 1500 be applied to any positive drug screen result due to possible 1600 interfering substances. A more specific alternate  chemical method 1700 must be used in order to obtain a confirmed analytical result.  1800 Gas chromato graphy / mass spectrometry (GC/MS) is the preferred 1900 confirmatory method.   Pregnancy, urine POC     Status: None   Collection Time: 02/10/16  1:54 AM  Result Value Ref Range   Preg Test, Ur NEGATIVE NEGATIVE    Comment:        THE SENSITIVITY OF THIS METHODOLOGY IS >24 mIU/mL     Current Facility-Administered Medications  Medication Dose Route Frequency Provider Last Rate Last Dose  . acetaminophen (TYLENOL) tablet 650 mg  650 mg Oral Q6H PRN Harrie Foreman, MD       Or  . acetaminophen (TYLENOL) suppository 650 mg  650 mg Rectal Q6H PRN Harrie Foreman, MD      . docusate sodium (COLACE) capsule 100 mg  100 mg Oral BID Harrie Foreman, MD      . enoxaparin (LOVENOX) injection 40 mg  40 mg Subcutaneous Q24H Harrie Foreman, MD      . ondansetron [facility]) tablet 4 mg  4 mg Oral Q6H PRN Harrie Foreman, MD       Or  . ondansetron [facility]) injection 4 mg  4 mg Intravenous Q6H PRN  Harrie Foreman, MD      . sodium chloride flush (NS) 0.9 % injection 3 mL  3 mL Intravenous Q12H Harrie Foreman, MD        Musculoskeletal: Strength & Muscle Tone: within normal limits Gait & Station: normal Patient leans: N/A  Psychiatric Specialty Exam: Physical Exam  Nursing note and vitals reviewed. Constitutional: She appears well-developed and well-nourished.  HENT:  Head: Normocephalic and atraumatic.  Eyes: Conjunctivae are normal. Pupils are equal, round, and reactive to light.  Neck: Normal range of motion.  Cardiovascular: Regular rhythm and normal heart sounds.   Respiratory: Effort normal. No respiratory distress.  GI: Soft.  Musculoskeletal: Normal range of motion.  Neurological: She is alert.  Skin: Skin is warm and dry.  Psychiatric: She has a normal mood and affect. Her speech is normal and behavior is normal. Thought content normal. She expresses  impulsivity. She exhibits abnormal recent memory.    Review of Systems  Constitutional: Negative.   HENT: Negative.   Eyes: Negative.   Respiratory: Negative.   Cardiovascular: Negative.   Gastrointestinal: Negative.   Musculoskeletal: Negative.   Skin: Negative.   Neurological: Negative.   Psychiatric/Behavioral: Positive for memory loss and substance abuse. Negative for depression, hallucinations and suicidal ideas. The patient has insomnia. The patient is not nervous/anxious.     Blood pressure (!) 91/58, pulse 95, temperature 98.3 F (36.8 C), temperature source Oral, resp. rate 19, height _0  (1.651 m), weight 51 kg (112 lb 8 oz), SpO2 100 %.Body mass index is 18.72 kg/m.  General Appearance: Casual  Eye Contact:  Good  Speech:  Clear and Coherent  Volume:  Increased  Mood:  Euphoric  Affect:  Congruent  Thought Process:  Goal Directed  Orientation:  Full (Time, Place, and Person)  Thought Content:  Tangential  Suicidal Thoughts:  No  Homicidal Thoughts:  No  Memory:  Immediate;   Good Recent;   Fair Remote;   Fair  Judgement:  Impaired  Insight:  Shallow  Psychomotor Activity:  Normal  Concentration:  Concentration: Fair  Recall:  [employer] of Knowledge:  Fair  Language:  Fair  Akathisia:  No  Handed:  Right  AIMS (if indicated):     Assets:  Communication Skills Physical Health Resilience  ADL's:  Intact  Cognition:  WNL  Sleep:        Treatment Plan Summary: Plan This is a 25 year old woman who took an overdose of some combination of sedating medicine. Her drug screen is positive for benzodiazepines and cocaine. It is possible that Flexeril could've shown up positive on the benzodiazepine screen. She is evidently not taking any extra opiates. Patient absolutely denies any suicidal ideation. Denies any recent depression. This is part of a pattern she has of abuse of sedating medicines that is Kristine Griffin up into the hospital several times. I told the patient  that I believe her that she was not trying to kill herself but tried to engage her in some conversation about her irresponsible use of drugs. That made her irritated. Patient does not require involuntary commitment. Did some education about the risks of combining sedatives or using nonprescribed sedatives. No further psychiatric treatment. She will follow up with [facility].  Disposition: Patient does not meet criteria for psychiatric inpatient admission. Supportive therapy provided about ongoing stressors.  Alethia Berthold, MD 02/10/2016 9:23 PM

## 2016-02-10 NOTE — Progress Notes (Signed)
After being seen by Psych MD and being taken off IVC, patient is asking if she can go home to be with her son. She is also asking for her Subaxone. [facility] doc paged. MD states that he would like to read the psych doc note before making a decision regarding her requests. RN told MD she would page again once note was available.

## 2016-02-10 NOTE — ED Notes (Signed)
Poison control called

## 2016-02-10 NOTE — Progress Notes (Signed)
Patient arrived to floor from Ed, non responsive respirations non labored and regular. VSS, pupils non reactive. Lungs clear and no signs of distress.

## 2016-02-10 NOTE — Progress Notes (Signed)
Pt placed on 2L . Intubation supplies on standby.

## 2016-02-10 NOTE — H&P (Signed)
Kristine Griffin is an 25 y.o. female.   Chief Complaint: Drug overdose HPI: The patient presents to the emergency department via EMS after being found unresponsive by some of her family members. Upon arrival the patient appeared obtunded but was protecting her airway. Oxygen saturations have been normal on room air. The patient cannot contribute to her own history as she is barely responsive. She reportedly overdosed on benzodiazepines, muscle relaxers and gabapentin. Cocaine was also found in her urine. Vital signs were stable and laboratory evaluation unremarkable. Due to continued minimal responsiveness the emergency department staff called for admission.  Past Medical History:  Diagnosis Date  . Hip dislocation, right ([place])    x2    No past surgical history on file. Cannot obtain as patient is unresponsive  No family history on file. Cannot obtain as patient is unresponsive Social History:  reports that she has been smoking.  She has never used smokeless tobacco. She reports that she drinks alcohol. She reports that she uses drugs.  Allergies: No Known Allergies  Medications Prior to Admission  Medication Sig Dispense Refill  . SUBOXONE 8-2 MG FILM Place 1.5 Film under the tongue daily.  0    Results for orders placed or performed during the hospital encounter of 02/10/16 (from the past 48 hour(s))  Comprehensive metabolic panel     Status: None   Collection Time: 02/10/16  1:24 AM  Result Value Ref Range   Sodium 140 135 - 145 mmol/L   Potassium 4.3 3.5 - 5.1 mmol/L   Chloride 110 101 - 111 mmol/L   CO2 24 22 - 32 mmol/L   Glucose, Bld 65 65 - 99 mg/dL   BUN 13 6 - 20 mg/dL   Creatinine, Ser 0.71 0.44 - 1.00 mg/dL   Calcium 9.0 8.9 - 10.3 mg/dL   Total Protein 6.9 6.5 - 8.1 g/dL   Albumin 4.2 3.5 - 5.0 g/dL   AST 19 15 - 41 U/L   ALT 16 14 - 54 U/L   Alkaline Phosphatase 48 38 - 126 U/L   Total Bilirubin 0.4 0.3 - 1.2 mg/dL   GFR calc non Af Amer >60 >60 mL/min   GFR  calc Af Amer >60 >60 mL/min    Comment: (NOTE) The eGFR has been calculated using the CKD EPI equation. This calculation has not been validated in all clinical situations. eGFR's persistently <60 mL/min signify possible Chronic Kidney Disease.    Anion gap 6 5 - 15  Ethanol     Status: None   Collection Time: 02/10/16  1:24 AM  Result Value Ref Range   Alcohol, Ethyl (B) <5 <5 mg/dL    Comment:        LOWEST DETECTABLE LIMIT FOR SERUM ALCOHOL IS 5 mg/dL FOR MEDICAL PURPOSES ONLY   Salicylate level     Status: None   Collection Time: 02/10/16  1:24 AM  Result Value Ref Range   Salicylate Lvl <7.7 2.8 - 30.0 mg/dL  Acetaminophen level     Status: Abnormal   Collection Time: 02/10/16  1:24 AM  Result Value Ref Range   Acetaminophen (Tylenol), Serum <10 (L) 10 - 30 ug/mL    Comment:        THERAPEUTIC CONCENTRATIONS VARY SIGNIFICANTLY. A RANGE OF 10-30 ug/mL MAY BE AN EFFECTIVE CONCENTRATION FOR MANY PATIENTS. HOWEVER, SOME ARE BEST TREATED AT CONCENTRATIONS OUTSIDE THIS RANGE. ACETAMINOPHEN CONCENTRATIONS >150 ug/mL AT 4 HOURS AFTER INGESTION AND >50 ug/mL AT 12 HOURS AFTER INGESTION  ARE OFTEN ASSOCIATED WITH TOXIC REACTIONS.   cbc     Status: None   Collection Time: 02/10/16  1:24 AM  Result Value Ref Range   WBC 7.6 3.6 - 11.0 K/uL   RBC 4.49 3.80 - 5.20 MIL/uL   Hemoglobin 13.8 12.0 - 16.0 g/dL   HCT 40.1 35.0 - 47.0 %   MCV 89.2 80.0 - 100.0 fL   MCH 30.7 26.0 - 34.0 pg   MCHC 34.5 32.0 - 36.0 g/dL   RDW 12.9 11.5 - 14.5 %   Platelets 196 150 - 440 K/uL  Urine Drug Screen, Qualitative     Status: Abnormal   Collection Time: 02/10/16  1:45 AM  Result Value Ref Range   Tricyclic, Ur Screen NONE DETECTED NONE DETECTED   Amphetamines, Ur Screen NONE DETECTED NONE DETECTED   MDMA (Ecstasy)Ur Screen NONE DETECTED NONE DETECTED   Cocaine Metabolite,Ur [place] POSITIVE (A) NONE DETECTED   Opiate, Ur Screen NONE DETECTED NONE DETECTED   Phencyclidine (PCP) Ur S NONE  DETECTED NONE DETECTED   Cannabinoid 50 Ng, Ur [place] NONE DETECTED NONE DETECTED   Barbiturates, Ur Screen NONE DETECTED NONE DETECTED   Benzodiazepine, Ur Scrn POSITIVE (A) NONE DETECTED   Methadone Scn, Ur NONE DETECTED NONE DETECTED    Comment: (NOTE) 536  Tricyclics, urine               Cutoff 1000 ng/mL 200  Amphetamines, urine             Cutoff 1000 ng/mL 300  MDMA (Ecstasy), urine           Cutoff 500 ng/mL 400  Cocaine Metabolite, urine       Cutoff 300 ng/mL 500  Opiate, urine                   Cutoff 300 ng/mL 600  Phencyclidine (PCP), urine      Cutoff 25 ng/mL 700  Cannabinoid, urine              Cutoff 50 ng/mL 800  Barbiturates, urine             Cutoff 200 ng/mL 900  Benzodiazepine, urine           Cutoff 200 ng/mL 1000 Methadone, urine                Cutoff 300 ng/mL 1100 1200 The urine drug screen provides only a preliminary, unconfirmed 1300 analytical test result and should not be used for non-medical 1400 purposes. Clinical consideration and professional judgment should 1500 be applied to any positive drug screen result due to possible 1600 interfering substances. A more specific alternate chemical method 1700 must be used in order to obtain a confirmed analytical result.  1800 Gas chromato graphy / mass spectrometry (GC/MS) is the preferred 1900 confirmatory method.   Pregnancy, urine POC     Status: None   Collection Time: 02/10/16  1:54 AM  Result Value Ref Range   Preg Test, Ur NEGATIVE NEGATIVE    Comment:        THE SENSITIVITY OF THIS METHODOLOGY IS >24 mIU/mL    No results found.  Review of Systems  Unable to perform ROS: Patient unresponsive    Blood pressure 102/76, pulse 89, temperature 98.2 F (36.8 C), temperature source Oral, resp. rate 16, height '5\' 5"'  (1.651 m), weight 51 kg (112 lb 8 oz), SpO2 100 %. Physical Exam  Vitals reviewed. Constitutional: She appears well-developed and well-nourished.  No distress.  HENT:  Head: Normocephalic  and atraumatic.  Mouth/Throat: Oropharynx is clear and moist.  Eyes: Conjunctivae are normal. Pupils are equal, round, and reactive to light. No scleral icterus.  No EOM  Neck: Normal range of motion. Neck supple. No JVD present. No tracheal deviation present. No thyromegaly present.  Cardiovascular: Normal rate, regular rhythm and normal heart sounds.  Exam reveals no gallop and no friction rub.   No murmur heard. Respiratory: Effort normal and breath sounds normal.  GI: Soft. Bowel sounds are normal. She exhibits no distension. There is no tenderness.  Genitourinary:  Genitourinary Comments: Deferred  Lymphadenopathy:    She has no cervical adenopathy.  Neurological: She is unresponsive. No cranial nerve deficit. She exhibits normal muscle tone.  Withdraws to pain  Skin: Skin is warm and dry. No rash noted. No erythema.  Psychiatric:  Patient is barely responsive     Assessment/Plan This is a 25 year old female admitted for drug overdose. 1. Overdose: The patient is protecting her airway despite her barely responsive state. Vital signs are currently within normal limits. Continue to monitor telemetry, pulse ox and respiratory rate.  2. DVT prophylaxis: Lovenox 3. GI prophylaxis: None The patient is a full code. Time spent on admission orders and patient care approximately 45 minutes  Harrie Foreman, MD 02/10/2016, 5:00 AM

## 2016-02-10 NOTE — Progress Notes (Signed)
[facility] [place] at [facility]   PATIENT NAME: Kristine RobinsonSamantha Griffin    MR#:  [id number]  DATE OF BIRTH:  [birth year]  SUBJECTIVE:  Brought in from Uncle's home after found unresponsive Pt's boyfriend from 2 years in the room. Pt sleeping all day. Woke up and asking where she is and who brought her here.. She remembers taking neurontin and muscle relaxant her aunt gave her. denis doing cocaine(reliability?) Wants to drink water and eat.  Denies SI or using IV drugs  REVIEW OF SYSTEMS:   Review of Systems  Constitutional: Negative for chills, fever and weight loss.  HENT: Negative for ear discharge, ear pain and nosebleeds.   Eyes: Negative for blurred vision, pain and discharge.  Respiratory: Negative for sputum production, shortness of breath, wheezing and stridor.   Cardiovascular: Negative for chest pain, palpitations, orthopnea and PND.  Gastrointestinal: Negative for abdominal pain, diarrhea, nausea and vomiting.  Genitourinary: Negative for frequency and urgency.  Musculoskeletal: Negative for back pain and joint pain.  Neurological: Positive for weakness. Negative for sensory change, speech change and focal weakness.  Psychiatric/Behavioral: Negative for depression and hallucinations. The patient is not nervous/anxious.    Tolerating Diet: Tolerating PT:   DRUG ALLERGIES:  No Known Allergies  VITALS:  Blood pressure 101/70, pulse 78, temperature 98.1 F (36.7 C), temperature source Oral, resp. rate 16, height 5\' 5"  (1.651 m), weight 51 kg (112 lb 8 oz), SpO2 100 %.  PHYSICAL EXAMINATION:   Physical Exam  GENERAL:  25 y.o.-year-old patient lying in the bed with no acute distress.  EYES: Pupils equal, round, reactive to light and accommodation. No scleral icterus. Extraocular muscles intact.  HEENT: Head atraumatic, normocephalic. Oropharynx and nasopharynx clear.  NECK:  Supple, no jugular venous distention. No thyroid enlargement, no  tenderness.  LUNGS: Normal breath sounds bilaterally, no wheezing, rales, rhonchi. No use of accessory muscles of respiration.  CARDIOVASCULAR: S1, S2 normal. No murmurs, rubs, or gallops.  ABDOMEN: Soft, nontender, nondistended. Bowel sounds present. No organomegaly or mass.  EXTREMITIES: No cyanosis, clubbing or edema b/l.    NEUROLOGIC: Cranial nerves II through XII are intact. No focal Motor or sensory deficits b/l.   PSYCHIATRIC:  patient is alert and oriented x 3.  SKIN: No obvious rash, lesion, or ulcer.   LABORATORY PANEL:  CBC  Recent Labs Lab 02/10/16 0124  WBC 7.6  HGB 13.8  HCT 40.1  PLT 196    Chemistries   Recent Labs Lab 02/10/16 0124  NA 140  K 4.3  CL 110  CO2 24  GLUCOSE 65  BUN 13  CREATININE 0.71  CALCIUM 9.0  AST 19  ALT 16  ALKPHOS 48  BILITOT 0.4    ASSESSMENT AND PLAN:   25 year old female admitted for drug overdose.  1. Overdose with gabapentin/muscle relaxant (unknown quantity) and UDS Positive for cocaine and benzodiazepines - NSR on tele -vitals stable, labs normal Positive UDS -d/c IVF and tele -soft diet Spoke with dr Toni Amendlapacs to see her -medically ok for d/c hence d/c disposition per psych eval  -2. DVT prophylaxis: Lovenox  3.h/o drug abuse -pt reports she goes to [facility] and she get suboxone rx every Friday. (she takes the dose on M-W and Friday) -I will await psych input regarding this  Spoke with pt and her boyfriend  Case discussed with Care Management/Social Worker. Management plans discussed with the patient, family and they are in agreement.  CODE STATUS: FULL  TOTAL TIME TAKING CARE  OF THIS PATIENT:30 minutes.  >50% time spent on counselling and coordination of care family and Dr clapacs  Note: This dictation was prepared with Dragon dictation along with smaller phrase technology. Any transcriptional errors that result from this process are unintentional.  Kelley Knoth M.D on 02/10/2016 at 11:54 AM  Between  7am to 6pm - Pager - [phone]  After 6pm go to [email or web address] - password [email or web address] [facility]  [place] [facility]  Office  [phone]  CC: Primary care physician; No primary care provider on file.

## 2016-02-10 NOTE — Progress Notes (Signed)
MD gave order to discontinue telemetry 

## 2016-02-10 NOTE — Progress Notes (Signed)
Patient told RN that she does not want anyone to visit her in the hospital and does not want anyone to be able to call and get information on how she is doing. RN placed a sign on door stating that visitors should come to the nurses station before entering the room.  Harvie HeckMelanie Kenderick Kobler, RN

## 2016-02-10 NOTE — ED Notes (Signed)
On hold for 20 minutes with poison control.  No personnel to speak with.

## 2016-02-10 NOTE — ED Provider Notes (Signed)
[facility] Emergency Department Provider Note    First MD Initiated Contact with Patient 02/10/16 0138     (approximate)  I have reviewed the triage vital signs and the nursing notes.  History Limited secondary to altered mental status HISTORY  Chief Complaint Drug Overdose    HPI Kristine Griffin is a 25 y.o. female with history of opiate and cocaine abuse presents via EMS with history of being found minimally responsive in a vehicle with an empty bottle of Cyclobenzaprine and Gabapentin however EMS did not bring the bottles to the emergency department.   Past Medical History:  Diagnosis Date  . Hip dislocation, right [facility](HCC)    x2    Patient Active Problem List   Diagnosis Date Noted  . Opiate abuse, episodic 10/27/2014  . Cocaine abuse 10/27/2014  . Hip pain 10/27/2014  . Depression 10/27/2014    No past surgical history on file.  Prior to Admission medications   Medication Sig Start Date End Date Taking? Authorizing Provider  SUBOXONE 8-2 MG FILM Place 1.5 Film under the tongue daily. 02/05/16  Yes Historical Provider, MD    Allergies No known drug allergies No family history on file.  Social History Social History  Substance Use Topics  . Smoking status: Current Every Day Smoker  . Smokeless tobacco: Never Used  . Alcohol use Yes    Review of Systems Constitutional: No fever/chills Eyes: No visual changes. ENT: No sore throat. Cardiovascular: Denies chest pain. Respiratory: Denies shortness of breath. Gastrointestinal: No abdominal pain.  No nausea, no vomiting.  No diarrhea.  No constipation. Genitourinary: Negative for dysuria. Musculoskeletal: Negative for back pain. Skin: Negative for rash. Neurological: Negative for headaches, focal weakness or numbness. Psychiatric:Possible medication overdose  10-point ROS otherwise negative.  ____________________________________________   PHYSICAL EXAM:  VITAL SIGNS: ED  Triage Vitals  Enc Vitals Group     BP 02/10/16 0125 112/73     Pulse Rate 02/10/16 0125 (!) 110     Resp 02/10/16 0125 12     Temp 02/10/16 0130 97.7 F (36.5 C)     Temp Source 02/10/16 0130 Oral     SpO2 02/10/16 0125 100 %     Weight 02/10/16 0126 112 lb (50.8 kg)     Height 02/10/16 0126 5\' 3"  (1.6 m)     Head Circumference --      Peak Flow --      Pain Score --      Pain Loc --      Pain Edu? --      Excl. in GC? --     Constitutional: Responsive to noxious stimuli Eyes: Conjunctivae are normal. PERRL. EOMI. Head: Atraumatic. Ears:  Healthy appearing ear canals and TMs bilaterally Nose: No congestion/rhinnorhea. Mouth/Throat: Mucous membranes are moist.  Oropharynx non-erythematous. Positive gag reflex Neck: No stridor.  No meningeal signs.   Cardiovascular: Normal rate, regular rhythm. Good peripheral circulation. Grossly normal heart sounds. Respiratory: Normal respiratory effort.  No retractions. Lungs CTAB. Gastrointestinal: Soft and nontender. No distention.   Musculoskeletal: No lower extremity tenderness nor edema. No gross deformities of extremities. Neurologic:  Somnolent but arousable to noxious stimuli Skin:  Skin is warm, dry and intact. No rash noted.   ____________________________________________   LABS (all labs ordered are listed, but only abnormal results are displayed)  Labs Reviewed  ACETAMINOPHEN LEVEL - Abnormal; Notable for the following:       Result Value   Acetaminophen (Tylenol), Serum <10 (*)  All other components within normal limits  URINE DRUG SCREEN, QUALITATIVE ([facility] ONLY) - Abnormal; Notable for the following:    Cocaine Metabolite,Ur [place] POSITIVE (*)    Benzodiazepine, Ur Scrn POSITIVE (*)    All other components within normal limits  COMPREHENSIVE METABOLIC PANEL  ETHANOL  SALICYLATE LEVEL  CBC  POCT PREGNANCY, URINE       Procedures   Critical Care performed: CRITICAL CARE Performed by: Darci Current   Total critical care time: 30 minutes  Critical care time was exclusive of separately billable procedures and treating other patients.  Critical care was necessary to treat or prevent imminent or life-threatening deterioration.  Critical care was time spent personally by me on the following activities: development of treatment plan with patient and/or surrogate as well as nursing, discussions with consultants, evaluation of patient's response to treatment, examination of patient, obtaining history from patient or surrogate, ordering and performing treatments and interventions, ordering and review of laboratory studies, ordering and review of radiographic studies, pulse oximetry and re-evaluation of patient's condition.  ____________________________________________   INITIAL IMPRESSION / ASSESSMENT AND PLAN / ED COURSE  Pertinent labs & imaging results that were available during my care of the patient were reviewed by me and considered in my medical decision making (see chart for details).  Given history obtained from EMS patient was involuntarily committed Patient minimally responsive to noxious July however vital signs stable oxygen saturation 100% with a gag reflex. As such patient was not intubated. During observation the emergency department the patient became more alert and agitated. Patient discussed with Dr. Sheryle Hail hospitalist on call for hospital admission for further evaluation and management   Clinical Course    ____________________________________________  FINAL CLINICAL IMPRESSION(S) / ED DIAGNOSES  Final diagnoses:  Polysubstance abuse  Intentional drug overdose, initial encounter [facility])     MEDICATIONS GIVEN DURING THIS VISIT:  Medications  etomidate (AMIDATE) injection 20 mg (not administered)  succinylcholine (ANECTINE) injection 120 mg (not administered)  sodium chloride 0.9 % bolus 1,000 mL (1,000 mLs Intravenous New Bag/Given 02/10/16 0235)  sodium  chloride 0.9 % bolus 1,000 mL (0 mLs Intravenous Stopped 02/10/16 0234)     NEW OUTPATIENT MEDICATIONS STARTED DURING THIS VISIT:  New Prescriptions   No medications on file    Modified Medications   No medications on file    Discontinued Medications   VENLAFAXINE (EFFEXOR) 37.5 MG TABLET    Take 37.5 mg by mouth.      Note:  This document was prepared using Dragon voice recognition software and may include unintentional dictation errors.     Darci Current, MD 02/10/16 [phone]

## 2016-02-10 NOTE — ED Notes (Signed)
Sitter remains at bedside. 1:1 supervision.

## 2016-02-11 NOTE — Progress Notes (Signed)
[facility]    Kristine Griffin was admitted to the Hospital on 02/10/2016 and Discharged  02/11/2016 and should be excused from work/school   for 1  days starting 02/10/2016 , may return to work/school without any restrictions.  Call Enedina FinnerSona Liston Thum MD, [facility]  [phone] with questions.  Houston Surges M.D on 02/11/2016,at 7:48 AM

## 2016-02-11 NOTE — Discharge Instructions (Signed)
Follow up at [facility] as before

## 2016-02-11 NOTE — Progress Notes (Signed)
Pt discharged from hospital with family (aunt). Belongings confirmed, pt states she is missing her bra. Room searched, no additional clothing found, pt has clothes in bag which were removed in ED. Pt does not wish to pursue missing item. Discharge instructions reviewed, recommend follow up with PCP.

## 2016-02-11 NOTE — Progress Notes (Signed)
MD paged and notified that Clapacs MD note was in the computer. MD decided patient should wait until am to discharge and ordered some pain medication for patient.

## 2016-02-11 NOTE — Discharge Summary (Signed)
[facility] [place] at [facility]   PATIENT NAME: Kristine Griffin    MR#:  [id number]  DATE OF BIRTH:  July 27, 1990  DATE OF ADMISSION:  02/10/2016 ADMITTING PHYSICIAN: Arnaldo Natal, MD  DATE OF DISCHARGE: 02/11/16  PRIMARY CARE PHYSICIAN: No primary care provider on file.    ADMISSION DIAGNOSIS:  Polysubstance abuse [F19.10] Intentional drug overdose, initial encounter (HCC) [T50.902A]  DISCHARGE DIAGNOSIS:  Intentional drug overdose(muslce relaxant/Neurontin-unknown amount) Cocaine abuse Tobacco abuse  SECONDARY DIAGNOSIS:   Past Medical History:  Diagnosis Date  . Hip dislocation, right (HCC)    x2    HOSPITAL COURSE:   25 year old female admitted for drug overdose.  1. Overdose with gabapentin/muscle relaxant (unknown quantity) and UDS Positive for cocaine and benzodiazepines - NSR on tele -vitals stable, labs normal Positive UDS -soft diet Appreciate dr Clapacsinput. No indication for inpt psych admission. Ok to d/c home and f[facility] with [facility]  -2. DVT prophylaxis: Lovenox  3.h/o drug abuse UDS postive for cocaine -pt reports she goes to [facility] and she get suboxone rx every Friday. (  overall stable D/c home CONSULTS OBTAINED:  Treatment Team:  Audery Amel, MD  DRUG ALLERGIES:  No Known Allergies  DISCHARGE MEDICATIONS:   Current Discharge Medication List    CONTINUE these medications which have NOT CHANGED   Details  SUBOXONE 8-2 MG FILM Place 1.5 Film under the tongue daily. Refills: 0        If you experience worsening of your admission symptoms, develop shortness of breath, life threatening emergency, suicidal or homicidal thoughts you must seek medical attention immediately by calling 911 or calling your MD immediately  if symptoms less severe.  You Must read complete instructions/literature along with all the possible adverse reactions/side effects for all the Medicines you take and that have been  prescribed to you. Take any new Medicines after you have completely understood and accept all the possible adverse reactions/side effects.   Please note  You were cared for by a hospitalist during your hospital stay. If you have any questions about your discharge medications or the care you received while you were in the hospital after you are discharged, you can call the unit and asked to speak with the hospitalist on call if the hospitalist that took care of you is not available. Once you are discharged, your primary care physician will handle any further medical issues. Please note that NO REFILLS for any discharge medications will be authorized once you are discharged, as it is imperative that you return to your primary care physician (or establish a relationship with a primary care physician if you do not have one) for your aftercare needs so that they can reassess your need for medications and monitor your lab values. Today   SUBJECTIVE   No new complaints  VITAL SIGNS:  Blood pressure (!) 88/48, pulse 77, temperature 98.5 F (36.9 C), temperature source Oral, resp. rate 16, height 5\' 5"  (1.651 m), weight 52 kg (114 lb 8.6 oz), SpO2 100 %.  I/O:   Intake/Output Summary (Last 24 hours) at 02/11/16 0741 Last data filed at 02/10/16 1934  Gross per 24 hour  Intake              960 ml  Output              200 ml  Net              760 ml    PHYSICAL EXAMINATION:  GENERAL:  25 y.o.-year-old patient lying in the bed with no acute distress.  EYES: Pupils equal, round, reactive to light and accommodation. No scleral icterus. Extraocular muscles intact.  HEENT: Head atraumatic, normocephalic. Oropharynx and nasopharynx clear.  NECK:  Supple, no jugular venous distention. No thyroid enlargement, no tenderness.  LUNGS: Normal breath sounds bilaterally, no wheezing, rales,rhonchi or crepitation. No use of accessory muscles of respiration.  CARDIOVASCULAR: S1, S2 normal. No murmurs, rubs, or  gallops.  ABDOMEN: Soft, non-tender, non-distended. Bowel sounds present. No organomegaly or mass.  EXTREMITIES: No pedal edema, cyanosis, or clubbing.  NEUROLOGIC: Cranial nerves II through XII are intact. Muscle strength 5/5 in all extremities. Sensation intact. Gait not checked.  PSYCHIATRIC: The patient is alert and oriented x 3.  SKIN: No obvious rash, lesion, or ulcer.   DATA REVIEW:   CBC   Recent Labs Lab 02/10/16 0124  WBC 7.6  HGB 13.8  HCT 40.1  PLT 196    Chemistries   Recent Labs Lab 02/10/16 0124  NA 140  K 4.3  CL 110  CO2 24  GLUCOSE 65  BUN 13  CREATININE 0.71  CALCIUM 9.0  AST 19  ALT 16  ALKPHOS 48  BILITOT 0.4    Microbiology Results   No results found for this or any previous visit (from the past 240 hour(s)).  RADIOLOGY:  No results found.   Management plans discussed with the patient, family and they are in agreement.  CODE STATUS:     Code Status Orders        Start     Ordered   02/10/16 0401  Full code  Continuous     02/10/16 0400    Code Status History    Date Active Date Inactive Code Status Order ID Comments User Context   This patient has a current code status but no historical code status.      TOTAL TIME TAKING CARE OF THIS PATIENT: 40 minutes.    Rollyn Scialdone M.D on 02/11/2016 at 7:41 AM  Between 7am to 6pm - Pager - [phone] After 6pm go to [email or web address] - password [email or web address] [facility]  [facility]  Office  [phone]  CC: Primary care physician; No primary care provider on file.
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
## 2017-07-13 ENCOUNTER — Encounter: Payer: Self-pay | Admitting: Obstetrics and Gynecology

## 2017-07-23 ENCOUNTER — Encounter: Payer: Self-pay | Admitting: Obstetrics and Gynecology

## 2017-07-30 ENCOUNTER — Ambulatory Visit: Payer: Medicaid Other | Admitting: Obstetrics and Gynecology

## 2017-07-30 ENCOUNTER — Encounter: Payer: Self-pay | Admitting: Obstetrics and Gynecology

## 2017-07-30 VITALS — BP 122/70 | Ht 63.0 in | Wt 151.0 lb

## 2017-07-30 DIAGNOSIS — N898 Other specified noninflammatory disorders of vagina: Secondary | ICD-10-CM | POA: Diagnosis not present

## 2017-07-30 LAB — POCT WET PREP WITH KOH
CLUE CELLS WET PREP PER HPF POC: NEGATIVE
KOH Prep POC: NEGATIVE
TRICHOMONAS UA: NEGATIVE
Yeast Wet Prep HPF POC: NEGATIVE

## 2017-07-30 NOTE — Progress Notes (Signed)
Chief Complaint  Patient presents with  . Vaginitis    HPI:      Ms. DEAYSIA GRIGORYAN is a 27 y.o. G1P1001 who LMP was No LMP recorded. Patient is not currently having periods (Reason: IUD)., presents today for NP eval of vaginal odor (sometimes fishy, sometimes bad) for the past 4 months. Hx of BV in the past prior to these sx.  She denies any real increased d/c, no itch/irritation. Saw [facility] about 4 months ago and diagnosed with BV, treated with flagyl without relief of odor. Saw PCP 1/19 and treated with flagyl again without sx relief. Went back last wk and had neg wet prep, encouraged to start probiotics. Odor not as bad today. Taking probiotics and pt thinks it may be helping some. Pt uses dove sens skin soap, no dryers sheets. She is sex active, no new partners. Has had neg gon/chlam testing. She has a Mirena IUD, replaced about a yr ago. No menses/BTB. Has occas cramping. Also has noted intermittent sharp or dull pelvic pains.    Past Medical History:  Diagnosis Date  . Hip dislocation, right (HCC)    x2    History reviewed. No pertinent surgical history.  Family History  Problem Relation Age of Onset  . Diabetes Mellitus II Maternal Grandmother   . Diabetes Mellitus II Maternal Grandfather     Social History   Socioeconomic History  . Marital status: Single    Spouse name: Not on file  . Number of children: Not on file  . Years of education: Not on file  . Highest education level: Not on file  Social Needs  . Financial resource strain: Not on file  . Food insecurity - worry: Not on file  . Food insecurity - inability: Not on file  . Transportation needs - medical: Not on file  . Transportation needs - non-medical: Not on file  Occupational History  . Not on file  Tobacco Use  . Smoking status: Former Games developer  . Smokeless tobacco: Never Used  Substance and Sexual Activity  . Alcohol use: No    Frequency: Never  . Drug use: No    Comment: H/o drug treatment  .  Sexual activity: Yes    Birth control/protection: IUD  Other Topics Concern  . Not on file  Social History Narrative  . Not on file     Current Outpatient Medications:  .  levonorgestrel (MIRENA, 52 MG,) 20 MCG/24HR IUD, by Intrauterine route., Disp: , Rfl:  .  SUBOXONE 8-2 MG FILM, Place 1.5 Film under the tongue daily., Disp: , Rfl: 0   ROS:  Review of Systems  Constitutional: Negative for fever.  Gastrointestinal: Negative for blood in stool, constipation, diarrhea, nausea and vomiting.  Genitourinary: Positive for dyspareunia, frequency and vaginal discharge. Negative for dysuria, flank pain, hematuria, urgency, vaginal bleeding and vaginal pain.  Musculoskeletal: Positive for arthralgias. Negative for back pain.  Skin: Negative for rash.     OBJECTIVE:   Vitals:  BP 122/70   Ht 5\' 3"  (1.6 m)   Wt 151 lb (68.5 kg)   BMI 26.75 kg/m   Physical Exam  Constitutional: She is oriented to person, place, and time and well-developed, well-nourished, and in no distress. Vital signs are normal.  Genitourinary: Vagina normal, uterus normal, cervix normal, right adnexa normal, left adnexa normal and vulva normal. Uterus is not enlarged. Cervix exhibits no motion tenderness and no tenderness. Right adnexum displays no mass and no tenderness. Left adnexum displays  no mass and no tenderness. Vulva exhibits no erythema, no exudate, no lesion, no rash and no tenderness. Vagina exhibits no lesion.  Genitourinary Comments: IUD STRINGS IN PLACE  Neurological: She is oriented to person, place, and time.  Vitals reviewed.   Results: Results for orders placed or performed in visit on 07/30/17 (from the past 24 hour(s))  POCT Wet Prep with KOH     Status: Normal   Collection Time: 07/30/17  1:40 PM  Result Value Ref Range   Trichomonas, UA Negative    Clue Cells Wet Prep HPF POC neg    Epithelial Wet Prep HPF POC  Few, Moderate, Many, Too numerous to count   Yeast Wet Prep HPF POC neg      Bacteria Wet Prep HPF POC  Few   RBC Wet Prep HPF POC     WBC Wet Prep HPF POC     KOH Prep POC Negative Negative     Assessment/Plan: Vaginal odor - Neg wet prep/hx of BV. Check One Swab AV and BV. Will f/u with results. RepHresh crm vag if odor worsens before results back. Cont probiotics. - Plan: Other/Misc lab test, POCT Wet Prep with KOH    Return if symptoms worsen or fail to improve.  Jatavion Peaster B. Damondre Pfeifle, PA-C 07/30/2017 1:43 PM

## 2017-07-30 NOTE — Patient Instructions (Signed)
I value your feedback and entrusting us with your care. If you get a [facility] patient survey, I would appreciate you taking the time to let us know about your experience today. Thank you! 

## 2017-08-07 ENCOUNTER — Telehealth: Payer: Self-pay | Admitting: Obstetrics and Gynecology

## 2017-08-07 MED ORDER — MOXIFLOXACIN HCL 400 MG PO TABS
400.0000 mg | ORAL_TABLET | Freq: Every day | ORAL | 0 refills | Status: AC
Start: 2017-08-07 — End: 2017-08-13

## 2017-08-07 NOTE — Telephone Encounter (Signed)
Pt is calling for her lab results please advise

## 2017-08-07 NOTE — Telephone Encounter (Signed)
Done.  See telephone note.

## 2017-08-07 NOTE — Telephone Encounter (Signed)
Pt aware of E. Coli and no BV on One Swab. Pt with sx again today. Will treat with Rx avelox 400 mg daily for 6 days. If sx persist, will then treat with clindamycin (since culture done 1 wk ago and pt now with sx again this AM after sex). Pt is very frustrated. May need maintenance tx. Pt to f/u via phone with sx

## 2017-08-30 ENCOUNTER — Ambulatory Visit (INDEPENDENT_AMBULATORY_CARE_PROVIDER_SITE_OTHER): Payer: Medicaid Other | Admitting: Obstetrics and Gynecology

## 2017-08-30 ENCOUNTER — Encounter: Payer: Self-pay | Admitting: Obstetrics and Gynecology

## 2017-08-30 VITALS — BP 100/60 | HR 94 | Ht 63.0 in | Wt 154.0 lb

## 2017-08-30 DIAGNOSIS — B9689 Other specified bacterial agents as the cause of diseases classified elsewhere: Secondary | ICD-10-CM | POA: Diagnosis not present

## 2017-08-30 DIAGNOSIS — N760 Acute vaginitis: Secondary | ICD-10-CM

## 2017-08-30 LAB — POCT WET PREP WITH KOH
Clue Cells Wet Prep HPF POC: POSITIVE
KOH PREP POC: POSITIVE — AB
Trichomonas, UA: NEGATIVE
YEAST WET PREP PER HPF POC: NEGATIVE

## 2017-08-30 MED ORDER — CLINDAMYCIN PHOSPHATE 2 % VA CREA: 1.0000 | TOPICAL_CREAM | Freq: Every day | VAGINAL | 0 refills | Status: DC

## 2017-08-30 NOTE — Patient Instructions (Signed)
I value your feedback and entrusting us with your care. If you get a [facility] patient survey, I would appreciate you taking the time to let us know about your experience today. Thank you! 

## 2017-08-30 NOTE — Progress Notes (Signed)
[name], NP   Chief Complaint  Patient presents with  . Vaginal Discharge    HPI:      [name] is a 27 y.o. G1P1001 who LMP was No LMP recorded. (Menstrual status: IUD)., presents today for increased vag d/c and odor without itch again. Sx for almost a wk. 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 Pt had neg BV and AV test 2/19, but E. Coli identified on One Swab. Since pt sx, treated with avelox without any sx change. Pt wasn't having BV sx at time of One swab 07/30/17. No LBP, belly pain, fevers.  Pt is no longer sex active. States was having sex 3 times daily when having BV sx and tx, so doesn't feel like medication had chance to work.  Pt uses dove sens skin soap, is now taking probiotics. Has IUD.   Past Medical History:  Diagnosis Date  . Hip dislocation, right (HCC)    x2    History reviewed. No pertinent surgical history.  Family History  Problem Relation Age of Onset  . Diabetes Mellitus II Maternal Grandmother   . Diabetes Mellitus II Maternal Grandfather     Social History   Socioeconomic History  . Marital status: Single    Spouse name: Not on file  . Number of children: Not on file  . Years of education: Not on file  . Highest education level: Not on file  Occupational History  . Not on file  Social Needs  . Financial resource strain: Not on file  . Food insecurity:    Worry: Not on file    Inability: Not on file  . Transportation needs:    Medical: Not on file    Non-medical: Not on file  Tobacco Use  . Smoking status: Former Games developer  . Smokeless tobacco: Never Used  Substance and Sexual Activity  . Alcohol use: No    Frequency: Never  . Drug use: No    Comment: H/o drug treatment  . Sexual activity: Yes    Birth control/protection: IUD  Lifestyle  . Physical activity:    Days per week: Not on file    Minutes per session: Not on file  . Stress: Not on file  Relationships    . Social connections:    Talks on phone: Not on file    Gets together: Not on file    Attends religious service: Not on file    Active member of club or organization: Not on file    Attends meetings of clubs or organizations: Not on file    Relationship status: Not on file  . Intimate partner violence:    Fear of current or ex partner: Not on file    Emotionally abused: Not on file    Physically abused: Not on file    Forced sexual activity: Not on file  Other Topics Concern  . Not on file  Social History Narrative  . Not on file    Outpatient Medications Prior to Visit  Medication Sig Dispense Refill  . levonorgestrel (MIRENA, 52 MG,) 20 MCG/24HR IUD by Intrauterine route.    . SUBOXONE 8-2 MG FILM Place 1.5 Film under the tongue daily.  0   No facility-administered medications prior to visit.       ROS:  Review of Systems  Constitutional: Negative for fever.  Gastrointestinal: Negative for blood in stool, constipation, diarrhea, nausea and vomiting.  Genitourinary: Positive  for dyspareunia, frequency and vaginal discharge. Negative for dysuria, flank pain, hematuria, urgency, vaginal bleeding and vaginal pain.  Musculoskeletal: Negative for back pain.  Skin: Negative for rash.   BREAST: No symptoms   OBJECTIVE:   Vitals:  BP 100/60   Pulse 94   Ht 5\' 3"  (1.6 m)   Wt 154 lb (69.9 kg)   BMI 27.28 kg/m   Physical Exam  Constitutional: She is oriented to person, place, and time and well-developed, well-nourished, and in no distress. Vital signs are normal.  Genitourinary: Uterus normal, cervix normal, right adnexa normal, left adnexa normal and vulva normal. Uterus is not enlarged. Cervix exhibits no motion tenderness and no tenderness. Right adnexum displays no mass and no tenderness. Left adnexum displays no mass and no tenderness. Vulva exhibits no erythema, no exudate, no lesion, no rash and no tenderness. Vagina exhibits no lesion. Thick  white and vaginal  discharge found.  Neurological: She is alert and oriented to person, place, and time.  Psychiatric: Memory, affect and judgment normal.  Vitals reviewed.   Results: Results for orders placed or performed in visit on 08/30/17 (from the past 24 hour(s))  POCT Wet Prep with KOH     Status: Abnormal   Collection Time: 08/30/17 10:30 AM  Result Value Ref Range   Trichomonas, UA Negative    Clue Cells Wet Prep HPF POC pos    Epithelial Wet Prep HPF POC  Few, Moderate, Many, Too numerous to count   Yeast Wet Prep HPF POC neg    Bacteria Wet Prep HPF POC  Few   RBC Wet Prep HPF POC     WBC Wet Prep HPF POC     KOH Prep POC Positive (A) Negative    Assessment/Plan: Bacterial vaginosis - Recurrent sx. Rx cleocin eRxd. If sx recur, will retreat and do maintenance tx. Cont probiotics. Not sex active which is good for sx control.  - Plan: clindamycin (CLEOCIN) 2 % vaginal cream, POCT Wet Prep with KOH    Meds ordered this encounter  Medications  . clindamycin (CLEOCIN) 2 % vaginal cream    Sig: Place 1 Applicatorful vaginally at bedtime.    Dispense:  40 g    Refill:  0    Order Specific Question:   Supervising Provider    Answer:   Nadara MustardHARRIS, ROBERT P [[id number]]     Return if symptoms worsen or fail to improve.  Alicia B. Copland, PA-C 08/30/2017 10:32 AM

## 2017-08-31 ENCOUNTER — Telehealth: Payer: Self-pay

## 2017-08-31 ENCOUNTER — Other Ambulatory Visit: Payer: Self-pay | Admitting: Obstetrics and Gynecology

## 2017-08-31 DIAGNOSIS — N760 Acute vaginitis: Principal | ICD-10-CM

## 2017-08-31 DIAGNOSIS — B9689 Other specified bacterial agents as the cause of diseases classified elsewhere: Secondary | ICD-10-CM

## 2017-08-31 MED ORDER — CLINDAMYCIN HCL 300 MG PO CAPS: 300.0000 mg | ORAL_CAPSULE | Freq: Two times a day (BID) | ORAL | 0 refills | Status: DC

## 2017-08-31 NOTE — Telephone Encounter (Signed)
Pt saw [facility] yesterday to see [facility] & was rx'd Clindamycin Cream. It is way too expensive & pt can't afford. Pt inquiring if she can get something less expensive/affordable. BJ#[phone]#[phone]

## 2017-08-31 NOTE — Telephone Encounter (Signed)
Going to OR can you have someone else review

## 2017-08-31 NOTE — Telephone Encounter (Signed)
Called and spoke with patient, sent prescription for oral clindamycin to her pharmacy. Thank you.

## 2017-08-31 NOTE — Telephone Encounter (Signed)
Can you please review in [facility] absence. Thank you

## 2017-08-31 NOTE — Telephone Encounter (Signed)
Please advise in [facility] absence. Thank you!

## 2017-10-02 ENCOUNTER — Other Ambulatory Visit: Payer: Self-pay | Admitting: Obstetrics and Gynecology

## 2017-10-02 ENCOUNTER — Telehealth: Payer: Self-pay

## 2017-10-02 DIAGNOSIS — N760 Acute vaginitis: Principal | ICD-10-CM

## 2017-10-02 DIAGNOSIS — B9689 Other specified bacterial agents as the cause of diseases classified elsewhere: Secondary | ICD-10-CM

## 2017-10-02 MED ORDER — CLINDAMYCIN HCL 300 MG PO CAPS: 300.0000 mg | ORAL_CAPSULE | Freq: Two times a day (BID) | ORAL | 0 refills | Status: DC

## 2017-10-02 NOTE — Telephone Encounter (Signed)
Rx RF clindamycin eRxd. If sx persist after tx, pt needs maintenance tx.

## 2017-10-02 NOTE — Telephone Encounter (Signed)
Pt aware and appreciative  

## 2017-10-02 NOTE — Telephone Encounter (Signed)
Pt seen for BV on 08/30/17 and does not feel like it completely went away and has now returned as bad if not worse than before. Pt states she was told to call if BV returned and would like to know if another rx could be sent in for her. Per pt she did not take last 3 pills of previous rx because she changed purses and lost them. Please advise. Pt cb# [phone] thank you

## 2017-10-02 NOTE — Progress Notes (Signed)
Rx RF for BV. F/u prn sx.

## 2017-10-17 ENCOUNTER — Telehealth: Payer: Self-pay

## 2017-10-17 NOTE — Telephone Encounter (Signed)
Called pt to let her know about getting a F/u appt to see if anything else going on. Pt states Helmut Muster told her "she would perscribe her a creme that she could do if nothing else worked" she is wanting to know why she needs to come in when she has not had intercourse and she is having the same symptoms as before. Pt stated she would "prefer if Helmut Muster spoke directly to her instead of someone else".

## 2017-10-17 NOTE — Telephone Encounter (Signed)
Spoke with pt. Had AV on culture 2/19 but no BV at that time. Treated with avelox without relief of vag d/c, odor sx. Pt was seen again 3/19 and had pos wet prep. Treated with flagyl and clindamycin without relief. Had it worked, I would have put her on wkly cleocin as maintenance, but since tx not working, pt needs to be seen again for another One Swab/further eval.

## 2017-10-17 NOTE — Telephone Encounter (Signed)
Pt has been treated with 2 different meds. If sx are not going away, then she needs appt. Both meds should have treated Bv, so if not working, then needs f/u appt. RN to sched.

## 2017-10-17 NOTE — Telephone Encounter (Signed)
Pt calling triage stating that the medication that [facility] sent in last time (pills) is not helping. States that they had talked about a preventative cream that she could RX and she is wondering if she could get that sent in to pharm. Please advise. I can call pt back after.

## 2017-10-22 ENCOUNTER — Ambulatory Visit: Payer: Medicaid Other | Admitting: Obstetrics and Gynecology

## 2017-10-24 ENCOUNTER — Ambulatory Visit: Payer: Medicaid Other | Admitting: Podiatry

## 2017-10-24 ENCOUNTER — Encounter: Payer: Self-pay | Admitting: Podiatry

## 2017-10-24 ENCOUNTER — Ambulatory Visit (INDEPENDENT_AMBULATORY_CARE_PROVIDER_SITE_OTHER): Payer: Medicaid Other | Admitting: Obstetrics and Gynecology

## 2017-10-24 ENCOUNTER — Ambulatory Visit (INDEPENDENT_AMBULATORY_CARE_PROVIDER_SITE_OTHER): Payer: Medicaid Other

## 2017-10-24 ENCOUNTER — Encounter: Payer: Self-pay | Admitting: Obstetrics and Gynecology

## 2017-10-24 VITALS — BP 104/65 | HR 70 | Temp 98.3°F

## 2017-10-24 VITALS — BP 112/76 | HR 80 | Ht 63.0 in | Wt 155.0 lb

## 2017-10-24 DIAGNOSIS — Z113 Encounter for screening for infections with a predominantly sexual mode of transmission: Secondary | ICD-10-CM

## 2017-10-24 DIAGNOSIS — M722 Plantar fascial fibromatosis: Secondary | ICD-10-CM

## 2017-10-24 DIAGNOSIS — N898 Other specified noninflammatory disorders of vagina: Secondary | ICD-10-CM | POA: Diagnosis not present

## 2017-10-24 LAB — POCT WET PREP WITH KOH
Clue Cells Wet Prep HPF POC: NEGATIVE
KOH PREP POC: NEGATIVE
TRICHOMONAS UA: NEGATIVE
YEAST WET PREP PER HPF POC: NEGATIVE

## 2017-10-24 MED ORDER — CLINDAMYCIN HCL 300 MG PO CAPS: 300.0000 mg | ORAL_CAPSULE | Freq: Two times a day (BID) | ORAL | 0 refills | Status: AC

## 2017-10-24 MED ORDER — METHYLPREDNISOLONE 4 MG PO TBPK: ORAL_TABLET | ORAL | 0 refills | Status: DC

## 2017-10-24 MED ORDER — CLINDAMYCIN PHOSPHATE 2 % VA CREA: 1.0000 | TOPICAL_CREAM | Freq: Every day | VAGINAL | 0 refills | Status: DC

## 2017-10-24 MED ORDER — MELOXICAM 15 MG PO TABS: 15.0000 mg | ORAL_TABLET | Freq: Every day | ORAL | 3 refills | Status: DC

## 2017-10-24 NOTE — Patient Instructions (Signed)

## 2017-10-24 NOTE — Patient Instructions (Signed)
I value your feedback and entrusting us with your care. If you get a [facility] patient survey, I would appreciate you taking the time to let us know about your experience today. Thank you! 

## 2017-10-24 NOTE — Progress Notes (Signed)
  Subjective:  Patient ID: Kristine Griffin, female    DOB: 09/04/1990,  MRN: [id number] HPI Chief Complaint  Patient presents with  . Foot Pain    Patient presents today for bilat heel/arch pain x 3-4 weeks.  She reports it feels like she is stepping on a sharp rock and is very painful when first getting up from sitting.  Her right foot is worse than the left.  She ahs been Taking Ibuprofen and stretching with no relief  . New Patient (Initial Visit)    27 y.o. female presents with the above complaint.   ROS: Denies fever chills nausea vomiting muscle aches pains calf pain back pain chest pain shortness of breath.  Past Medical History:  Diagnosis Date  . Hip dislocation, right (HCC)    x2   No past surgical history on file.  Current Outpatient Medications:  .  levonorgestrel (MIRENA, 52 MG,) 20 MCG/24HR IUD, by Intrauterine route., Disp: , Rfl:  .  meloxicam (MOBIC) 15 MG tablet, Take 1 tablet (15 mg total) by mouth daily., Disp: 30 tablet, Rfl: 3 .  methylPREDNISolone (MEDROL DOSEPAK) 4 MG TBPK tablet, 6 day dose pack - take as directed, Disp: 21 tablet, Rfl: 0 .  SUBOXONE 8-2 MG FILM, Place 1.5 Film under the tongue daily., Disp: , Rfl: 0  No Known Allergies Review of Systems Objective:   Vitals:   10/24/17 1044  BP: 104/65  Pulse: 70  Temp: 98.3 F (36.8 C)    General: Well developed, nourished, in no acute distress, alert and oriented x3   Dermatological: Skin is warm, dry and supple bilateral. Nails x 10 are well maintained; remaining integument appears unremarkable at this time. There are no open sores, no preulcerative lesions, no rash or signs of infection present.  Vascular: Dorsalis Pedis artery and Posterior Tibial artery pedal pulses are 2/4 bilateral with immedate capillary fill time. Pedal hair growth present. No varicosities and no lower extremity edema present bilateral.   Neruologic: Grossly intact via light touch bilateral. Vibratory intact via tuning  fork bilateral. Protective threshold with Semmes Wienstein monofilament intact to all pedal sites bilateral. Patellar and Achilles deep tendon reflexes 2+ bilateral. No Babinski or clonus noted bilateral.   Musculoskeletal: No gross boney pedal deformities bilateral. No pain, crepitus, or limitation noted with foot and ankle range of motion bilateral. Muscular strength 5/5 in all groups tested bilateral.  Severe pain on palpation medial calcaneal tubercles bilateral heels.  Gait: Unassisted, Nonantalgic.    Radiographs:  Radiographs taken today demonstrate an osseously mature individual soft tissue increase in density plantar calcaneal insertion site otherwise no acute findings.  Assessment & Plan:   Assessment: Plantar fasciitis bilateral.  Right greater than left.    Plan: Discussed etiology pathology conservative versus surgical therapies.  Of her injections which she declined today.  Start her on a Medrol Dosepak to be followed by meloxicam.  Discussed appropriate shoe gear stretching exercise ice therapy sugar modifications.  Follow-up with her in 1 month     Max T. [place], [place]

## 2017-10-24 NOTE — Progress Notes (Signed)
Kristine Mould, NP   Chief Complaint  Patient presents with  . Vaginitis    HPI:      [name] is a 27 y.o. G1P1001 who LMP was No LMP recorded. (Menstrual status: IUD)., presents today for recurrent vaginal odor. Pt has been seen for this several times. See below.  07/30/17  vaginal odor (sometimes fishy, sometimes bad) for the past 4 months. Hx of BV in the past prior to these sx.  She denies any real increased d/c, no itch/irritation. Saw [facility] about 4 months ago and diagnosed with BV, treated with flagyl without relief of odor. Saw PCP 1/19 and treated with flagyl again without sx relief. Went back last wk and had neg wet prep, encouraged to start probiotics. Odor not as bad today. Taking probiotics and pt thinks it may be helping some. Pt uses dove sens skin soap, no dryers sheets. She is sex active, no new partners. Has had neg gon/chlam testing.  08/30/17  increased vag d/c and odor without itch again. Sx for almost a wk. 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 Pt had neg BV and AV test 2/19, but E. Coli identified on One Swab. Since pt sx, treated with avelox without any sx change. Pt wasn't having BV sx at time of One swab 07/30/17. No LBP, belly pain, fevers.  Pt is no longer sex active. States was having sex 3 times daily when having BV sx and tx, so doesn't feel like medication had chance to work. Pt had pos wet prep at exam and given cleocin crm (too expensive) so changed to clindamycin 300 mg BID for 7 days.  10/24/17 Pt has been treated with cleocin caps (crm too expensive) 3/19 and 4/19 due to sx. Pt states meds work but then sx recur. In hindsight, she now realizes the meds work until she is sex active with partner again (had broken up but still have unprotected sex occas) and then gets horrible odor, no real itch/irritation. Was sex active last wknd and sx have recurred. Odor horrible yesterday, better today. Sex was  extra painful this past wknd, too. She states the odor is improved temporarily with showering and can fluctuate on a daily basis. She ran out of probiotics. Still using dove sens skin soap. Very frustrated.   Past Medical History:  Diagnosis Date  . Hip dislocation, right (HCC)    x2    History reviewed. No pertinent surgical history.  Family History  Problem Relation Age of Onset  . Diabetes Mellitus II Maternal Grandmother   . Diabetes Mellitus II Maternal Grandfather     Social History   Socioeconomic History  . Marital status: Single    Spouse name: Not on file  . Number of children: Not on file  . Years of education: Not on file  . Highest education level: Not on file  Occupational History  . Not on file  Social Needs  . Financial resource strain: Not on file  . Food insecurity:    Worry: Not on file    Inability: Not on file  . Transportation needs:    Medical: Not on file    Non-medical: Not on file  Tobacco Use  . Smoking status: Former Games developer  . Smokeless tobacco: Never Used  Substance and Sexual Activity  . Alcohol use: No    Frequency: Never  . Drug use: No    Comment: H/o drug treatment  .  Sexual activity: Yes    Birth control/protection: IUD  Lifestyle  . Physical activity:    Days per week: Not on file    Minutes per session: Not on file  . Stress: Not on file  Relationships  . Social connections:    Talks on phone: Not on file    Gets together: Not on file    Attends religious service: Not on file    Active member of club or organization: Not on file    Attends meetings of clubs or organizations: Not on file    Relationship status: Not on file  . Intimate partner violence:    Fear of current or ex partner: Not on file    Emotionally abused: Not on file    Physically abused: Not on file    Forced sexual activity: Not on file  Other Topics Concern  . Not on file  Social History Narrative  . Not on file    Outpatient Medications Prior to  Visit  Medication Sig Dispense Refill  . levonorgestrel (MIRENA, 52 MG,) 20 MCG/24HR IUD by Intrauterine route.    . meloxicam (MOBIC) 15 MG tablet Take 1 tablet (15 mg total) by mouth daily. 30 tablet 3  . methylPREDNISolone (MEDROL DOSEPAK) 4 MG TBPK tablet 6 day dose pack - take as directed 21 tablet 0  . SUBOXONE 8-2 MG FILM Place 1.5 Film under the tongue daily.  0   No facility-administered medications prior to visit.     ROS:  Review of Systems  Constitutional: Negative for fever.  Gastrointestinal: Positive for constipation. Negative for blood in stool, diarrhea, nausea and vomiting.  Genitourinary: Positive for dyspareunia and vaginal discharge. Negative for dysuria, flank pain, frequency, hematuria, urgency, vaginal bleeding and vaginal pain.  Musculoskeletal: Negative for back pain.  Skin: Negative for rash.    OBJECTIVE:   Vitals:  BP 112/76   Pulse 80   Ht  (1.6 m)   Wt 155 lb (70.3 kg)   BMI 27.46 kg/m   Physical Exam  Constitutional: She is oriented to person, place, and time. Vital signs are normal. She appears well-developed.  Pulmonary/Chest: Effort normal.  Genitourinary: Vagina normal and uterus normal. There is no rash, tenderness or lesion on the right labia. There is no rash, tenderness or lesion on the left labia. Uterus is not enlarged and not tender. Cervix exhibits no motion tenderness. Right adnexum displays no mass and no tenderness. Left adnexum displays no mass and no tenderness. No erythema or tenderness in the vagina. No vaginal discharge found.  Musculoskeletal: Normal range of motion.  Neurological: She is alert and oriented to person, place, and time.  Psychiatric: She has a normal mood and affect. Her behavior is normal. Thought content normal.  Vitals reviewed.   Results: Results for orders placed or performed in visit on 10/24/17 (from the past 24 hour(s))  POCT Wet Prep with KOH     Status: Normal   Collection Time: 10/24/17   4:12 PM  Result Value Ref Range   Trichomonas, UA Negative    Clue Cells Wet Prep HPF POC neg    Epithelial Wet Prep HPF POC  Few, Moderate, Many, Too numerous to count   Yeast Wet Prep HPF POC neg    Bacteria Wet Prep HPF POC  Few   RBC Wet Prep HPF POC     WBC Wet Prep HPF POC     KOH Prep POC Negative Negative     Assessment/Plan: Vaginal  odor - Neg wet prep/exam. Check one Swab AV, BV, and leukorrhea. If neg, check other pathogens. Rx clindaymycin in meantime. No sex with partner/use condoms.  - Plan: POCT Wet Prep with KOH, Other/Misc lab test, clindamycin (CLEOCIN) 300 MG capsule, DISCONTINUED: clindamycin (CLEOCIN) 2 % vaginal cream  Screening for STD (sexually transmitted disease) - Plan: Other/Misc lab test    Meds ordered this encounter  Medications  . DISCONTD: clindamycin (CLEOCIN) 2 % vaginal cream    Sig: Place 1 Applicatorful vaginally at bedtime.    Dispense:  40 g    Refill:  0    Order Specific Question:   Supervising Provider    Answer:   Nadara Mustard [id number]  . clindamycin (CLEOCIN) 300 MG capsule    Sig: Take 1 capsule (300 mg total) by mouth 2 (two) times daily for 7 days.    Dispense:  14 capsule    Refill:  0    Order Specific Question:   Supervising Provider    Answer:   Nadara Mustard [[id number]]      Return if symptoms worsen or fail to improve.  Alicia B. Copland, PA-C 10/24/2017 4:18 PM

## 2017-10-30 ENCOUNTER — Telehealth: Payer: Self-pay

## 2017-10-30 MED ORDER — CLINDAMYCIN HCL 300 MG PO CAPS: ORAL_CAPSULE | ORAL | 0 refills | Status: DC

## 2017-10-30 MED ORDER — MOXIFLOXACIN HCL 400 MG PO TABS: 400.0000 mg | ORAL_TABLET | Freq: Every day | ORAL | 0 refills | Status: AC

## 2017-10-30 NOTE — Telephone Encounter (Signed)
Pls let pt know culture showed 4 types of BV bacteria (not going to respond well to flagyl but will respond to clindamycin) and aerobic vaginitis, needing avelox Rx tx again. Will eRx avelox and more clindamycin for pt. Pt to take 2 additional pills in [facility] and then take 1 pill Q4 days for 3 months as BV preventive.

## 2017-10-30 NOTE — Telephone Encounter (Signed)
Pls let pt know culture showed 4 types of BV bacteria (not going to respond well to flagyl but will respond to clindamycin) and aerobic vaginitis, needing avelox Rx tx again. Will eRx avelox and more clindamycin for pt. Pt to take 2 additional pills in [facility] and then take 1 pill Q4 days for 3 months as BV preventive.  

## 2017-10-30 NOTE — Telephone Encounter (Signed)
Pt states two pills of clindamycin fell into the air vent.  That's a whole day's worth and she can't afford to miss these pills.  Can two pills be called in or does the whole rx need to be called in?  She wouldn't mind the whole rx to be called in b/c she wants to be sure it's gone.  It is working!  [phone]

## 2017-10-30 NOTE — Telephone Encounter (Signed)
Pt aware.  Asked if she should come in for test of cure to be sure clindamycin worked.  Adv I would ask.

## 2017-10-31 NOTE — Telephone Encounter (Signed)
No, we treat based on sx. Pls let pt know.

## 2017-11-01 NOTE — Telephone Encounter (Signed)
Pt aware.

## 2017-11-26 ENCOUNTER — Ambulatory Visit: Payer: Medicaid Other | Admitting: Podiatry

## 2017-11-26 ENCOUNTER — Encounter: Payer: Self-pay | Admitting: Podiatry

## 2017-11-26 DIAGNOSIS — M722 Plantar fascial fibromatosis: Secondary | ICD-10-CM | POA: Diagnosis not present

## 2017-11-26 MED ORDER — METHYLPREDNISOLONE 4 MG PO TABS: ORAL_TABLET | ORAL | 0 refills | Status: DC

## 2017-11-26 NOTE — Progress Notes (Signed)
She presents today states that her heels are still hurting very badly the meloxicam does not seem to be working for her.  Objective: Vital signs are stable she is alert and oriented x3 no change in her past medical history medications allergies no change in her physical exam.  Still severe pain on palpation medial calcaneal tubercles bilateral.  Assessment: Pain in limb secondary to plantar fasciitis bilateral.  Plan: Performed a injection to the bilateral heels today after sterile Betadine skin prep 20 mg Kenalog 5 mg Marcaine point maximal tenderness bilaterally.  Tolerated procedure well without complications.  Wrote another prescription for Medrol Dosepak to be followed by Celebrex.  She will discontinue the meloxicam.  Follow-up with her in 1 month

## 2017-11-28 ENCOUNTER — Ambulatory Visit: Payer: Medicaid Other | Admitting: Podiatry

## 2017-12-03 ENCOUNTER — Telehealth: Payer: Self-pay

## 2017-12-03 NOTE — Telephone Encounter (Signed)
Pt states she is on a med that she is supposed to take every four days for three months.  She has lost the med and hasn't had any of it in a week.  [facility]  I called pt to get name of med - it's clindamycin.  [phone]

## 2017-12-04 ENCOUNTER — Other Ambulatory Visit: Payer: Self-pay | Admitting: Obstetrics and Gynecology

## 2017-12-04 MED ORDER — CLINDAMYCIN HCL 300 MG PO CAPS: ORAL_CAPSULE | ORAL | 0 refills | Status: DC

## 2017-12-04 NOTE — Telephone Encounter (Signed)
Rx RF eRxd. RN to notify pt.

## 2017-12-04 NOTE — Progress Notes (Signed)
Rx RF since pt lost med.

## 2017-12-05 NOTE — Telephone Encounter (Signed)
Pt aware.

## 2018-01-04 ENCOUNTER — Other Ambulatory Visit
Admission: EM | Admit: 2018-01-04 | Discharge: 2018-01-04 | Disposition: A | Discharge status: Home / Self Care | Attending: Family Medicine | Admitting: Family Medicine

## 2018-01-04 ENCOUNTER — Other Ambulatory Visit
Admission: AD | Admit: 2018-01-04 | Discharge: 2018-01-04 | Disposition: A | Discharge status: Home / Self Care | Attending: Family Medicine | Admitting: Family Medicine

## 2018-01-04 ENCOUNTER — Telehealth: Payer: Self-pay | Admitting: Emergency Medicine

## 2018-01-04 ENCOUNTER — Emergency Department
Admission: EM | Admit: 2018-01-04 | Discharge: 2018-01-04 | Payer: Medicaid Other | Attending: Emergency Medicine | Admitting: Emergency Medicine

## 2018-01-04 ENCOUNTER — Other Ambulatory Visit: Payer: Self-pay

## 2018-01-04 DIAGNOSIS — Z0289 Encounter for other administrative examinations: Secondary | ICD-10-CM | POA: Diagnosis not present

## 2018-01-04 DIAGNOSIS — Z87891 Personal history of nicotine dependence: Secondary | ICD-10-CM | POA: Diagnosis not present

## 2018-01-04 DIAGNOSIS — T424X4A Poisoning by benzodiazepines, undetermined, initial encounter: Secondary | ICD-10-CM

## 2018-01-04 LAB — ETHANOL

## 2018-01-04 LAB — CBC WITH DIFFERENTIAL/PLATELET
BASOS PCT: 1 %
Basophils Absolute: 0.1 10*3/uL (ref 0–0.1)
EOS PCT: 3 %
Eosinophils Absolute: 0.3 10*3/uL (ref 0–0.7)
HEMATOCRIT: 38.9 % (ref 35.0–47.0)
Hemoglobin: 13.3 g/dL (ref 12.0–16.0)
LYMPHS PCT: 32 %
Lymphs Abs: 3.2 10*3/uL (ref 1.0–3.6)
MCH: 29.8 pg (ref 26.0–34.0)
MCHC: 34.2 g/dL (ref 32.0–36.0)
MCV: 87.2 fL (ref 80.0–100.0)
MONO ABS: 0.7 10*3/uL (ref 0.2–0.9)
Monocytes Relative: 8 %
NEUTROS ABS: 5.7 10*3/uL (ref 1.4–6.5)
NEUTROS PCT: 56 %
Platelets: 283 10*3/uL (ref 150–440)
RBC: 4.47 MIL/uL (ref 3.80–5.20)
RDW: 13.3 % (ref 11.5–14.5)
WBC: 10 10*3/uL (ref 3.6–11.0)

## 2018-01-04 LAB — COMPREHENSIVE METABOLIC PANEL
ALT: 18 U/L (ref 0–44)
ANION GAP: 4 — AB (ref 5–15)
AST: 28 U/L (ref 15–41)
Albumin: 3.8 g/dL (ref 3.5–5.0)
Alkaline Phosphatase: 54 U/L (ref 38–126)
BILIRUBIN TOTAL: 0.6 mg/dL (ref 0.3–1.2)
BUN: 16 mg/dL (ref 6–20)
CHLORIDE: 108 mmol/L (ref 98–111)
CO2: 29 mmol/L (ref 22–32)
Calcium: 9.2 mg/dL (ref 8.9–10.3)
Creatinine, Ser: 0.55 mg/dL (ref 0.44–1.00)
Glucose, Bld: 104 mg/dL — ABNORMAL HIGH (ref 70–99)
POTASSIUM: 4.5 mmol/L (ref 3.5–5.1)
Sodium: 141 mmol/L (ref 135–145)
TOTAL PROTEIN: 6.8 g/dL (ref 6.5–8.1)

## 2018-01-04 LAB — HCG, QUANTITATIVE, PREGNANCY: hCG, Beta Chain, Quant, S: <1 m[IU]/mL (ref <5)

## 2018-01-04 MED ORDER — FLUMAZENIL 0.5 MG/5ML IV SOLN
0.2000 mg | Freq: Once | INTRAVENOUS | Status: AC
  Administered 2018-01-04: 0.2 mg via INTRAVENOUS
  Filled 2018-01-04: qty 5

## 2018-01-04 NOTE — ED Notes (Signed)
No labs at this time per MD Rifenbark 

## 2018-01-04 NOTE — ED Notes (Signed)
Patient ambulatory to triage with steady gait, without difficulty or distress noted, in custody of [place] PD officer Pride for forensic blood draw; pt A&Ox3, with no c/o voiced and denies need to see ED provider; pt voices good understanding of blood draw to be performed for forensic testing; pt verifies identity with name and DOB; using sealed kit provided by officer, tourniquet applied to left upper arm; left antecubital region prepped with betadine swab and allowed to dry completely; needle inserted and 2 grey top blood tubes collected; tourniquet removed, needle removed & intact, dressing applied; tubes labeled, given to officer and placed in sealed container using chain of custody; pt tolerated well and continues to deny c/o or need to see ED provider; pt d/c in police custody.  

## 2018-01-04 NOTE — ED Triage Notes (Signed)
Patient brought into this ED for forensic blood draw. Patient falling [facility]. Patient now checking for medical clearance.

## 2018-01-04 NOTE — ED Notes (Signed)
Taken to police car via wheelchair by this RN

## 2018-01-04 NOTE — ED Notes (Addendum)
Pt left in custody of [employer] Pride and Officer Benfield. Pt stands and ambulates to bathroom with this RN. Pt drowsy, but alert and oriented. All of belongings sent with patient and officer.

## 2018-01-04 NOTE — ED Provider Notes (Addendum)
[facility] Emergency Department Provider Note  ____________________________________________   First MD Initiated Contact with Patient 01/04/18 0408     (approximate)  I have reviewed the triage vital signs and the nursing notes.   HISTORY  Chief Complaint Medical Clearance  Level 5 exemption history limited by the patient's clinical condition  HPI Kristine Griffin is a 27 y.o. female is brought to the emergency department for medical clearance prior to booking in the custody of [place] police.  Reobtained with no police present.  The patient states she is an opiate addict and has been taking Suboxone for over a year.  Tonight she used her regular dose of Suboxone and has not used illicit drugs in over one year.  Earlier today her long-term boyfriend broke up with her and she became distressed so she took a friend's Xanax to help calm her down and then got into the car to drive where she was subsequently pulled over for driving under the influence.  The patient denies other drug or alcohol use.  According to police she blew "0" on the breathalyzer.  She came to the emergency department today merely for forensic blood draw however in triage she was difficult to arouse so they brought her to me for medical clearance.  The patient herself has no complaints at this time.  She very clearly is able to state that she has not used benzodiazepines in over a year.    Past Medical History:  Diagnosis Date  . Hip dislocation, right [facility])    x2    Patient Active Problem List   Diagnosis Date Noted  . Overdose of muscle relaxant 02/10/2016  . Opiate abuse, continuous (HCC) 02/10/2016  . 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 Opiate abuse, episodic (HCC) 10/27/2014  . Cocaine abuse (HCC) 10/27/2014  . Hip pain 10/27/2014  . Depression 10/27/2014  . Back pain 04/15/2012    History reviewed. No pertinent surgical history.  Prior to Admission medications   Medication  Sig Start Date End Date Taking? Authorizing Provider  clindamycin (CLEOCIN) 300 MG capsule Take 1 capsule Q4 days for 3 months as preventive 12/04/17   Copland, Ilona Sorrel, PA-C  levonorgestrel (MIRENA, 52 MG,) 20 MCG/24HR IUD by Intrauterine route.    [provider]  meloxicam (MOBIC) 15 MG tablet Take 1 tablet (15 mg total) by mouth daily. 10/24/17   Hyatt, Max T, DPM  methylPREDNISolone (MEDROL) 4 MG tablet Take as directed 11/26/17   Hyatt, Max T, DPM  SUBOXONE 8-2 MG FILM Place 1.5 Film under the tongue daily. 02/05/16   [provider]    Allergies Patient has no known allergies.  Family History  Problem Relation Age of Onset  . Diabetes Mellitus II Maternal Grandmother   . Diabetes Mellitus II Maternal Grandfather     Social History Social History   Tobacco Use  . Smoking status: Former Games developer  . Smokeless tobacco: Never Used  Substance Use Topics  . Alcohol use: No    Frequency: Never  . Drug use: No    Comment: H/o drug treatment    Review of Systems Level 5 exemption history limited by the patient's clinical condition ____________________________________________   PHYSICAL EXAM:  VITAL SIGNS: ED Triage Vitals [01/04/18 0404]  Enc Vitals Group     BP      Pulse      Resp      Temp      Temp src  SpO2      Weight 155 lb (70.3 kg)     Height 5\' 4"  (1.626 m)     Head Circumference      Peak Flow      Pain Score 0     Pain Loc      Pain Edu?      Excl. in GC?    Constitutional: Sleepy but arousable.  Quite sedated and actively falling asleep during our conversation Eyes: PERRL EOMI. pupils are midrange and brisk Head: Atraumatic. Nose: No congestion/rhinnorhea. Mouth/Throat: No trismus Neck: No stridor.  No meningismus Cardiovascular: Normal rate, regular rhythm. Grossly normal heart sounds.  Good peripheral circulation. Respiratory: Normal respiratory effort.  No retractions. Lungs CTAB and moving good air Gastrointestinal: Soft  nontender Musculoskeletal: No lower extremity edema   Neurologic: Moves all 4.  Normal reflexes.  No clonus Skin:  Skin is warm, dry and intact. No rash noted. Psychiatric: Appears intoxicated with benzodiazepine toxidrome   ____________________________________________  LABS (all labs ordered are listed, but only abnormal results are displayed)  Labs Reviewed  COMPREHENSIVE METABOLIC PANEL - Abnormal; Notable for the following components:      Result Value   Glucose, Bld 104 (*)    Anion gap 4 (*)    All other components within normal limits  ETHANOL  CBC WITH DIFFERENTIAL/PLATELET  HCG, QUANTITATIVE, PREGNANCY    Lab work reviewed by me with no acute disease __________________________________________  EKG   ____________________________________________  RADIOLOGY   ____________________________________________   DIFFERENTIAL includes but not limited to  Alcohol intoxication, drug overdose, intracerebral hemorrhage, metabolic derangement   PROCEDURES  Procedure(s) performed: no  .Critical Care Performed by: Merrily Brittleifenbark, Jennalee Greaves, MD Authorized by: Merrily Brittleifenbark, Beau Vanduzer, MD   Critical care provider statement:    Critical care time (minutes):  30   Critical care time was exclusive of:  Separately billable procedures and treating other patients   Critical care was necessary to treat or prevent imminent or life-threatening deterioration of the following conditions:  Toxidrome   Critical care was time spent personally by me on the following activities:  Development of treatment plan with patient or surrogate, discussions with consultants, evaluation of patient's response to treatment, examination of patient, obtaining history from patient or surrogate, ordering and performing treatments and interventions, ordering and review of laboratory studies, ordering and review of radiographic studies, pulse oximetry, re-evaluation of patient's condition and review of old  charts    Critical Care performed: no  ____________________________________________   INITIAL IMPRESSION / ASSESSMENT AND PLAN / ED COURSE  Pertinent labs & imaging results that were available during my care of the patient were reviewed by me and considered in my medical decision making (see chart for details).   As part of my medical decision making, I reviewed the following data within the electronic MEDICAL RECORD NUMBER History obtained from family if available, nursing notes, old chart and ekg, as well as notes from prior ED visits.  The patient arrives with normal vital signs although is falling asleep during my exam.  While continually providing stimulus to arouse her she is able to state that she took alprazolam today and has not used benzodiazepines in "over a year".  Decision was made to give a trial of low-dose flumazenil to see if this would reverse her symptoms otherwise we would have to pursue other etiology of her altered mental status.  Lab work including ethanol are pending.  Following 0.2 mg of IV flumazenil the patient's mental status quickly improved.  Her electrolytes are unremarkable and her ethanol is negative.  She subsequently became sleepy again with benzodiazepine toxidrome.  At this point as she is in custody of police and is not being discharged on her own recognizance I do believe she is medically stable for booking.      ____________________________________________   FINAL CLINICAL IMPRESSION(S) / ED DIAGNOSES  Final diagnoses:  Benzodiazepine overdose of undetermined intent, initial encounter      NEW MEDICATIONS STARTED DURING THIS VISIT:  Discharge Medication List as of 01/04/2018  5:11 AM       Note:  This document was prepared using Dragon voice recognition software and may include unintentional dictation errors.      Merrily Brittle, MD 01/06/18 4098    Merrily Brittle, MD 01/06/18 2235

## 2018-01-04 NOTE — Telephone Encounter (Signed)
Patient ambulatory to triage with steady gait, without difficulty or distress noted, in custody of [place] PD officer Bernerd Pho for forensic blood draw; pt A&Ox3, with no c/o voiced and denies need to see ED provider; pt voices good understanding of blood draw to be performed for forensic testing; pt verifies identity with name and DOB; using sealed kit provided by officer, tourniquet applied to left upper arm; left antecubital region prepped with betadine swab and allowed to dry completely; needle inserted and 2 grey top blood tubes collected; tourniquet removed, needle removed & intact, dressing applied; tubes labeled, given to officer and placed in sealed container using chain of custody; pt tolerated well and continues to deny c/o or need to see ED provider; pt d/c in police custody

## 2018-01-04 NOTE — Discharge Instructions (Signed)
Ms. Hassie BruceMabee is medically stable for booking.

## 2018-01-04 NOTE — ED Triage Notes (Signed)
Pt arrives to ED in custody of [place] [employer] for medical clearance for incarceration. Pt is drowsy, appears intoxicated, denies any c/o pain at this time.

## 2018-01-07 ENCOUNTER — Telehealth: Payer: Self-pay

## 2018-01-07 NOTE — Telephone Encounter (Signed)
Pt called triage line stating she had alerts from my chart, she hasn't  seen us in a few months so I advised her her lab results were from the hospital where she had been admitted on 8/2

## 2018-01-24 ENCOUNTER — Ambulatory Visit: Payer: Medicaid Other | Admitting: Podiatry

## 2018-02-12 ENCOUNTER — Telehealth: Payer: Self-pay | Admitting: *Deleted

## 2018-02-12 ENCOUNTER — Ambulatory Visit: Payer: Medicaid Other | Admitting: Podiatry

## 2018-02-12 ENCOUNTER — Encounter: Payer: Self-pay | Admitting: Podiatry

## 2018-02-12 DIAGNOSIS — G8929 Other chronic pain: Secondary | ICD-10-CM

## 2018-02-12 DIAGNOSIS — M722 Plantar fascial fibromatosis: Secondary | ICD-10-CM | POA: Diagnosis not present

## 2018-02-12 MED ORDER — CELECOXIB 200 MG PO CAPS: 200.0000 mg | ORAL_CAPSULE | Freq: Two times a day (BID) | ORAL | 3 refills | Status: DC

## 2018-02-12 NOTE — Telephone Encounter (Signed)
-----   Message from Kristian Covey, [facility] sent at 02/12/2018  1:33 PM EDT ----- Regarding: NeuroSurg NeuroSurgery referral -   Chronic back pain, possibly affecting feet

## 2018-02-12 NOTE — Progress Notes (Signed)
She presents today for follow-up of her plantar fasciitis.  She states that it has started hurting again and I think I will have to have the shots.  She is also complaining of back pain and she was given the gabapentin for her back pain.  Objective: Vital signs are stable she is alert and oriented x3.  She has pain on palpation medial calcaneal tubercles bilateral right greater than left.  Assessment: Pain in limb secondary to plantar fasciitis and back pain.  Plan: Requesting a evaluation from neurosurgery for chronic back pain.  This could possibly be associated with her feet.  I injected the bilateral heels today with 20 mg Kenalog 5 mg Marcaine after sterile Betadine skin prep.  Also wrote a prescription for Celebrex.

## 2018-02-12 NOTE — Telephone Encounter (Signed)
Referral to [facility]. 

## 2018-02-13 ENCOUNTER — Telehealth: Payer: Self-pay | Admitting: Podiatry

## 2018-02-13 DIAGNOSIS — G8929 Other chronic pain: Secondary | ICD-10-CM

## 2018-02-13 NOTE — Telephone Encounter (Signed)
I asked pt if she would like me to make another referral to neurology, possibly near her home. Pt states [place] would be closer to her. I told pt I would make a referral to [facility] and they would call her. Faxed referral, clinicals and demographics to [facility].

## 2018-02-13 NOTE — Telephone Encounter (Signed)
I put in as Kristine Griffin but its a [place] Griffin.  [facility] called and did get the referral for chronic pain and stated they do not treat chronic pain. They did put it in the referral but wanted you to be aware also.

## 2018-02-19 ENCOUNTER — Other Ambulatory Visit (HOSPITAL_COMMUNITY)
Admission: RE | Admit: 2018-02-19 | Discharge: 2018-02-19 | Disposition: A | Discharge status: Home / Self Care | Payer: Medicaid Other | Source: Ambulatory Visit | Attending: Obstetrics and Gynecology | Admitting: Obstetrics and Gynecology

## 2018-02-19 ENCOUNTER — Ambulatory Visit (INDEPENDENT_AMBULATORY_CARE_PROVIDER_SITE_OTHER): Payer: Medicaid Other | Admitting: Obstetrics and Gynecology

## 2018-02-19 ENCOUNTER — Encounter: Payer: Self-pay | Admitting: Obstetrics and Gynecology

## 2018-02-19 VITALS — BP 98/64 | HR 80 | Ht 63.0 in | Wt 152.0 lb

## 2018-02-19 DIAGNOSIS — N760 Acute vaginitis: Secondary | ICD-10-CM | POA: Diagnosis not present

## 2018-02-19 DIAGNOSIS — Z113 Encounter for screening for infections with a predominantly sexual mode of transmission: Secondary | ICD-10-CM | POA: Diagnosis not present

## 2018-02-19 DIAGNOSIS — B9689 Other specified bacterial agents as the cause of diseases classified elsewhere: Secondary | ICD-10-CM | POA: Diagnosis not present

## 2018-02-19 DIAGNOSIS — Z124 Encounter for screening for malignant neoplasm of cervix: Secondary | ICD-10-CM

## 2018-02-19 LAB — POCT WET PREP WITH KOH
CLUE CELLS WET PREP PER HPF POC: POSITIVE
KOH Prep POC: POSITIVE — AB
Trichomonas, UA: NEGATIVE
Yeast Wet Prep HPF POC: NEGATIVE

## 2018-02-19 MED ORDER — CLINDAMYCIN HCL 300 MG PO CAPS: 300.0000 mg | ORAL_CAPSULE | Freq: Two times a day (BID) | ORAL | 0 refills | Status: DC

## 2018-02-19 NOTE — Progress Notes (Signed)
Kristine Griffin, Kristine Burney, NP   Chief Complaint  Patient presents with  . Pelvic Pain    having more discharge than normal, odor x 1 week, pt thinks she has BV    HPI:      Ms. Kristine FiedlerSamantha N Griffin is a 27 y.o. G1P1001 who LMP was No LMP recorded. (Menstrual status: IUD)., presents today for vaginal odor and irritation for the past 5 days. 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 Pt hasn't been sex active until 5 days ago with certain partner, and sx then recurred. Pt had One Swab culture in past that showed 4 different BV bacteria, best responsive to clindamycin, and AV. Pt had dyspareunia this time, too. Has had intermittent sharp pelvic pains since, no meds needed. No GI sx, no urin sx, no fevers.   No recent pap smear/annual. Has IUD.   Past Medical History:  Diagnosis Date  . BV (bacterial vaginosis)   . Hip dislocation, right (HCC)    x2    History reviewed. No pertinent surgical history.  Family History  Problem Relation Age of Onset  . Diabetes Mellitus II Maternal Grandmother   . Diabetes Mellitus II Maternal Grandfather     Social History   Socioeconomic History  . Marital status: Single    Spouse name: Not on file  . Number of children: Not on file  . Years of education: Not on file  . Highest education level: Not on file  Occupational History  . Not on file  Social Needs  . Financial resource strain: Not on file  . Food insecurity:    Worry: Not on file    Inability: Not on file  . Transportation needs:    Medical: Not on file    Non-medical: Not on file  Tobacco Use  . Smoking status: Former Games developermoker  . Smokeless tobacco: Never Used  Substance and Sexual Activity  . Alcohol use: No    Frequency: Never  . Drug use: No    Comment: H/o drug treatment  . Sexual activity: Yes    Birth control/protection: IUD    Comment: Mirena  Lifestyle  . Physical activity:    Days per week: Not on file    Minutes  per session: Not on file  . Stress: Not on file  Relationships  . Social connections:    Talks on phone: Not on file    Gets together: Not on file    Attends religious service: Not on file    Active member of club or organization: Not on file    Attends meetings of clubs or organizations: Not on file    Relationship status: Not on file  . Intimate partner violence:    Fear of current or ex partner: Not on file    Emotionally abused: Not on file    Physically abused: Not on file    Forced sexual activity: Not on file  Other Topics Concern  . Not on file  Social History Narrative  . Not on file    Outpatient Medications Prior to Visit  Medication Sig Dispense Refill  . celecoxib (CELEBREX) 200 MG capsule Take 1 capsule (200 mg total) by mouth 2 (two) times daily. 60 capsule 3  . gabapentin (NEURONTIN) 300 MG capsule Take 300 mg by mouth 3 (three) times daily.  1  . levonorgestrel (MIRENA, 52 MG,) 20 MCG/24HR IUD by Intrauterine route.    Kevan Ny. SUBOXONE  8-2 MG FILM Place 1.5 Film under the tongue daily.  0  . clindamycin (CLEOCIN) 300 MG capsule Take 1 capsule Q4 days for 3 months as preventive 30 capsule 0  . meloxicam (MOBIC) 15 MG tablet Take 1 tablet (15 mg total) by mouth daily. 30 tablet 3   No facility-administered medications prior to visit.     ROS:  Review of Systems  Constitutional: Negative for fever.  Gastrointestinal: Negative for blood in stool, constipation, diarrhea, nausea and vomiting.  Genitourinary: Positive for dyspareunia. Negative for dysuria, flank pain, frequency, hematuria, urgency, vaginal bleeding, vaginal discharge and vaginal pain.  Musculoskeletal: Negative for back pain.  Skin: Negative for rash.    OBJECTIVE:   Vitals:  BP 98/64   Pulse 80   Ht 5\' 3"  (1.6 m)   Wt 152 lb (68.9 kg)   BMI 26.93 kg/m   Physical Exam  Constitutional: She is oriented to person, place, and time. Vital signs are normal. She appears well-developed.    Pulmonary/Chest: Effort normal.  Genitourinary: Vagina normal and uterus normal. There is no rash, tenderness or lesion on the right labia. There is no rash, tenderness or lesion on the left labia. Uterus is not enlarged and not tender. Cervix exhibits no motion tenderness. Right adnexum displays no mass and no tenderness. Left adnexum displays no mass and no tenderness. No erythema or tenderness in the vagina. No vaginal discharge found.  Genitourinary Comments: IUD STRINGS IN CX OS  Musculoskeletal: Normal range of motion.  Neurological: She is alert and oriented to person, place, and time.  Psychiatric: She has a normal mood and affect. Her behavior is normal. Thought content normal.  Vitals reviewed.   Results: Results for orders placed or performed in visit on 02/19/18 (from the past 24 hour(s))  POCT Wet Prep with KOH     Status: Abnormal   Collection Time: 02/19/18  2:35 PM  Result Value Ref Range   Trichomonas, UA Negative    Clue Cells Wet Prep HPF POC pos    Epithelial Wet Prep HPF POC     Yeast Wet Prep HPF POC neg    Bacteria Wet Prep HPF POC     RBC Wet Prep HPF POC     WBC Wet Prep HPF POC     KOH Prep POC Positive (A) Negative     Assessment/Plan: Bacterial vaginosis - Pos wet prep/sx. Rx clindamycin. Will do maintenance tx if pt going to be sex active with certain partner since he triggers sx. Condoms.  - Plan: clindamycin (CLEOCIN) 300 MG capsule, POCT Wet Prep with KOH  Cervical cancer screening - Plan: Cytology - PAP, CANCELED: Cytology - PAP  Screening for STD (sexually transmitted disease) - Plan: Cytology - PAP, CANCELED: Cytology - PAP    Meds ordered this encounter  Medications  . clindamycin (CLEOCIN) 300 MG capsule    Sig: Take 1 capsule (300 mg total) by mouth 2 (two) times daily for 7 days.    Dispense:  14 capsule    Refill:  0    Order Specific Question:   Supervising Provider    Answer:   Nadara Mustard [[id number]]      Return if symptoms  worsen or fail to improve.  Kyrie Fludd B. Jerae Izard, PA-C 02/19/2018 2:36 PM

## 2018-02-19 NOTE — Patient Instructions (Signed)
I value your feedback and entrusting us with your care. If you get a [facility] patient survey, I would appreciate you taking the time to let us know about your experience today. Thank you! 

## 2018-02-20 LAB — CYTOLOGY - PAP
Chlamydia: NEGATIVE
Diagnosis: NEGATIVE
NEISSERIA GONORRHEA: NEGATIVE
TRICH (WINDOWPATH): NEGATIVE

## 2018-02-25 ENCOUNTER — Encounter: Payer: Self-pay | Admitting: Obstetrics and Gynecology

## 2018-02-28 ENCOUNTER — Encounter: Payer: Self-pay | Admitting: Obstetrics and Gynecology

## 2018-02-28 ENCOUNTER — Other Ambulatory Visit: Payer: Self-pay | Admitting: Obstetrics and Gynecology

## 2018-02-28 DIAGNOSIS — N760 Acute vaginitis: Principal | ICD-10-CM

## 2018-02-28 DIAGNOSIS — B9689 Other specified bacterial agents as the cause of diseases classified elsewhere: Secondary | ICD-10-CM

## 2018-02-28 MED ORDER — CLINDAMYCIN HCL 300 MG PO CAPS: 300.0000 mg | ORAL_CAPSULE | Freq: Two times a day (BID) | ORAL | 0 refills | Status: DC

## 2018-02-28 NOTE — Progress Notes (Signed)
Rx RF clindamycin. Last 2 days abx thrown away and pt having sx again.

## 2018-03-02 ENCOUNTER — Emergency Department
Admission: EM | Admit: 2018-03-02 | Discharge: 2018-03-03 | Disposition: A | Discharge status: Home / Self Care | Payer: Medicaid Other | Attending: Emergency Medicine | Admitting: Emergency Medicine

## 2018-03-02 DIAGNOSIS — F1910 Other psychoactive substance abuse, uncomplicated: Secondary | ICD-10-CM | POA: Diagnosis not present

## 2018-03-02 DIAGNOSIS — R4182 Altered mental status, unspecified: Secondary | ICD-10-CM | POA: Diagnosis present

## 2018-03-02 DIAGNOSIS — Z87891 Personal history of nicotine dependence: Secondary | ICD-10-CM | POA: Diagnosis not present

## 2018-03-02 DIAGNOSIS — Z79899 Other long term (current) drug therapy: Secondary | ICD-10-CM | POA: Insufficient documentation

## 2018-03-02 LAB — CBC WITH DIFFERENTIAL/PLATELET
BASOS ABS: 0.1 10*3/uL (ref 0–0.1)
Basophils Relative: 1 %
Eosinophils Absolute: 0.2 10*3/uL (ref 0–0.7)
Eosinophils Relative: 2 %
HEMATOCRIT: 39.3 % (ref 35.0–47.0)
HEMOGLOBIN: 13.7 g/dL (ref 12.0–16.0)
LYMPHS PCT: 26 %
Lymphs Abs: 3.4 10*3/uL (ref 1.0–3.6)
MCH: 30.4 pg (ref 26.0–34.0)
MCHC: 34.8 g/dL (ref 32.0–36.0)
MCV: 87.4 fL (ref 80.0–100.0)
MONOS PCT: 7 %
Monocytes Absolute: 0.9 10*3/uL (ref 0.2–0.9)
NEUTROS ABS: 8.7 10*3/uL — AB (ref 1.4–6.5)
NEUTROS PCT: 66 %
Platelets: 269 10*3/uL (ref 150–440)
RBC: 4.49 MIL/uL (ref 3.80–5.20)
RDW: 13.7 % (ref 11.5–14.5)
WBC: 13.3 10*3/uL — ABNORMAL HIGH (ref 3.6–11.0)

## 2018-03-02 LAB — BASIC METABOLIC PANEL
ANION GAP: 7 (ref 5–15)
BUN: 10 mg/dL (ref 6–20)
CHLORIDE: 107 mmol/L (ref 98–111)
CO2: 27 mmol/L (ref 22–32)
Calcium: 9.3 mg/dL (ref 8.9–10.3)
Creatinine, Ser: 0.62 mg/dL (ref 0.44–1.00)
GFR calc non Af Amer: >60 mL/min (ref >60)
GLUCOSE: 107 mg/dL — AB (ref 70–99)
Potassium: 4.1 mmol/L (ref 3.5–5.1)
Sodium: 141 mmol/L (ref 135–145)

## 2018-03-02 LAB — URINE DRUG SCREEN, QUALITATIVE (ARMC ONLY)
AMPHETAMINES, UR SCREEN: NOT DETECTED
Barbiturates, Ur Screen: NOT DETECTED
Benzodiazepine, Ur Scrn: POSITIVE — AB
Cannabinoid 50 Ng, Ur ~~LOC~~: NOT DETECTED
Cocaine Metabolite,Ur ~~LOC~~: NOT DETECTED
MDMA (ECSTASY) UR SCREEN: NOT DETECTED
METHADONE SCREEN, URINE: NOT DETECTED
OPIATE, UR SCREEN: NOT DETECTED
Phencyclidine (PCP) Ur S: NOT DETECTED
TRICYCLIC, UR SCREEN: POSITIVE — AB

## 2018-03-02 LAB — HCG, QUANTITATIVE, PREGNANCY: hCG, Beta Chain, Quant, S: <1 m[IU]/mL (ref <5)

## 2018-03-02 MED ORDER — ONDANSETRON HCL 4 MG/2ML IJ SOLN
INTRAMUSCULAR | Status: AC
  Administered 2018-03-02: 19:00:00
  Filled 2018-03-02: qty 2

## 2018-03-02 MED ORDER — SODIUM CHLORIDE 0.9 % IV BOLUS
1000.0000 mL | Freq: Once | INTRAVENOUS | Status: AC
  Administered 2018-03-03: 1000 mL via INTRAVENOUS

## 2018-03-02 MED ORDER — NALOXONE HCL 2 MG/2ML IJ SOSY
0.4000 mg | PREFILLED_SYRINGE | Freq: Once | INTRAMUSCULAR | Status: AC
  Administered 2018-03-02: 0.4 mg via INTRAVENOUS

## 2018-03-02 NOTE — ED Notes (Signed)
1:1 sitting with this patient.

## 2018-03-02 NOTE — ED Triage Notes (Signed)
Pt arrives via [facility] after being pulled over by police for swerving. Protocol initiated
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
## 2018-03-03 NOTE — ED Notes (Signed)
Pt responds to voice at this time ,still struggling  to stay awake and talk .

## 2018-03-03 NOTE — ED Notes (Signed)
Reported from previous nurse that patient

## 2018-03-03 NOTE — ED Provider Notes (Signed)
-----------------------------------------   5:28 AM on 03/03/2018 -----------------------------------------  Patient is awake she is alert.  She tells me she took Xanax last night she is upset that she took it.  She is trying to call someone to come pick her up.  Patient has been monitored on continuous pulse oximeter throughout her stay.  No concerning issues.  I believe the patient is safe for discharge home into the care of friends or family.   Minna Antis, MD 03/03/18 [phone]

## 2018-04-01 ENCOUNTER — Encounter: Payer: Self-pay | Admitting: Podiatry

## 2018-04-01 ENCOUNTER — Ambulatory Visit: Payer: Medicaid Other | Admitting: Podiatry

## 2018-04-01 DIAGNOSIS — M25551 Pain in right hip: Secondary | ICD-10-CM

## 2018-04-01 DIAGNOSIS — M722 Plantar fascial fibromatosis: Secondary | ICD-10-CM

## 2018-04-01 MED ORDER — CELECOXIB 200 MG PO CAPS: 200.0000 mg | ORAL_CAPSULE | Freq: Every day | ORAL | 3 refills | Status: DC

## 2018-04-01 MED ORDER — CELECOXIB 200 MG PO CAPS: 200.0000 mg | ORAL_CAPSULE | Freq: Two times a day (BID) | ORAL | 3 refills | Status: DC

## 2018-04-02 NOTE — Progress Notes (Signed)
She presents today for follow-up of pain to the bilateral heels.  States that the left one is doing pretty good but the right one is still bothersome.  She states that she still having pain in her right lower back and right hip and has not followed up with orthopedic practice.  Objective: Vital signs are stable she is alert and oriented x3.  Pulses are palpable.  Pain on palpation medial calcaneal tubercle of the right heel none on the left.  No open lesions or wounds.  Assessment: Plantar fasciitis resolving left intractable plantar fasciitis right heel.  Plan: Discussed etiology pathology conservative versus surgical therapies at this point after sterile Betadine skin prep injected 20 mg Kenalog 5 mg Marcaine point maximal tenderness right heel and refilled her Celebrex.  She tolerated procedure well without complications we also made a referral to [facility] for evaluation of the right hip and lower back.

## 2018-04-09 ENCOUNTER — Encounter: Payer: Self-pay | Admitting: Obstetrics and Gynecology

## 2018-04-10 ENCOUNTER — Other Ambulatory Visit: Payer: Self-pay | Admitting: Obstetrics and Gynecology

## 2018-04-10 DIAGNOSIS — N760 Acute vaginitis: Principal | ICD-10-CM

## 2018-04-10 DIAGNOSIS — B9689 Other specified bacterial agents as the cause of diseases classified elsewhere: Secondary | ICD-10-CM

## 2018-04-10 MED ORDER — CLINDAMYCIN HCL 300 MG PO CAPS: 300.0000 mg | ORAL_CAPSULE | Freq: Two times a day (BID) | ORAL | 0 refills | Status: DC

## 2018-04-10 NOTE — Progress Notes (Signed)
Rx RF recurrent BV. Add boric acid supp.

## 2018-05-26 ENCOUNTER — Encounter: Payer: Self-pay | Admitting: Obstetrics and Gynecology

## 2018-05-28 ENCOUNTER — Other Ambulatory Visit: Payer: Self-pay | Admitting: Obstetrics and Gynecology

## 2018-05-28 DIAGNOSIS — N760 Acute vaginitis: Principal | ICD-10-CM

## 2018-05-28 DIAGNOSIS — B9689 Other specified bacterial agents as the cause of diseases classified elsewhere: Secondary | ICD-10-CM

## 2018-05-28 MED ORDER — CLINDAMYCIN HCL 300 MG PO CAPS: 300.0000 mg | ORAL_CAPSULE | Freq: Two times a day (BID) | ORAL | 0 refills | Status: AC

## 2018-05-28 NOTE — Progress Notes (Signed)
Rx RF cleocin for recurrent BV. Add boric acid supp.

## 2018-07-03 ENCOUNTER — Encounter: Payer: Self-pay | Admitting: Podiatry

## 2018-07-03 ENCOUNTER — Ambulatory Visit (INDEPENDENT_AMBULATORY_CARE_PROVIDER_SITE_OTHER): Payer: Medicaid Other | Admitting: Podiatry

## 2018-07-03 DIAGNOSIS — M722 Plantar fascial fibromatosis: Secondary | ICD-10-CM

## 2018-07-03 MED ORDER — CELECOXIB 200 MG PO CAPS: 200.0000 mg | ORAL_CAPSULE | Freq: Every day | ORAL | 3 refills | Status: DC

## 2018-07-03 NOTE — Progress Notes (Signed)
She presents today for follow-up of plantar fasciitis she states that they are both hurting and I would like to have another refill on my Celebrex.  Objective: Vital signs stable alert oriented x3.  Pulses are palpable severe pain on palpation mid calcaneal tubercles bilateral.  Assessment: Pain in limb secondary to plantar fasciitis bilateral.  Plan: Injected the bilateral heels today and represcribed her Celebrex.  Injected her with 20 mg Kenalog 5 mg Marcaine point maximal tenderness bilateral.  Follow-up with her on an as-needed basis.

## 2018-08-08 ENCOUNTER — Encounter: Payer: Self-pay | Admitting: Obstetrics and Gynecology

## 2018-08-09 ENCOUNTER — Encounter: Payer: Self-pay | Admitting: Obstetrics and Gynecology

## 2018-08-09 ENCOUNTER — Other Ambulatory Visit: Payer: Self-pay | Admitting: Orthopedic Surgery

## 2018-08-09 DIAGNOSIS — M25551 Pain in right hip: Secondary | ICD-10-CM

## 2018-08-09 DIAGNOSIS — G8929 Other chronic pain: Secondary | ICD-10-CM

## 2018-08-09 DIAGNOSIS — Q6589 Other specified congenital deformities of hip: Secondary | ICD-10-CM

## 2018-08-10 ENCOUNTER — Other Ambulatory Visit: Payer: Self-pay | Admitting: Obstetrics and Gynecology

## 2018-08-10 DIAGNOSIS — B9689 Other specified bacterial agents as the cause of diseases classified elsewhere: Secondary | ICD-10-CM

## 2018-08-10 DIAGNOSIS — N760 Acute vaginitis: Principal | ICD-10-CM

## 2018-08-10 MED ORDER — CLINDAMYCIN HCL 300 MG PO CAPS: 300.0000 mg | ORAL_CAPSULE | Freq: Two times a day (BID) | ORAL | 0 refills | Status: DC

## 2018-08-10 NOTE — Progress Notes (Signed)
Rx clindamycin Rx resent to [facility] (didn't want [facility]).

## 2018-08-10 NOTE — Progress Notes (Signed)
Rx RF clindamycin for BV sx 

## 2018-08-13 ENCOUNTER — Encounter: Payer: Self-pay | Admitting: Obstetrics and Gynecology

## 2018-08-13 ENCOUNTER — Ambulatory Visit: Payer: Medicaid Other | Admitting: Obstetrics and Gynecology

## 2018-08-13 VITALS — BP 100/70 | HR 69 | Ht 61.0 in | Wt 159.0 lb

## 2018-08-13 DIAGNOSIS — Z30432 Encounter for removal of intrauterine contraceptive device: Secondary | ICD-10-CM

## 2018-08-13 DIAGNOSIS — Z30013 Encounter for initial prescription of injectable contraceptive: Secondary | ICD-10-CM

## 2018-08-13 MED ORDER — MEDROXYPROGESTERONE ACETATE 150 MG/ML IM SUSY: 150.0000 mg | PREFILLED_SYRINGE | Freq: Once | INTRAMUSCULAR | 1 refills | Status: DC

## 2018-08-13 NOTE — Patient Instructions (Signed)
I value your feedback and entrusting us with your care. If you get a [facility] patient survey, I would appreciate you taking the time to let us know about your experience today. Thank you! 

## 2018-08-13 NOTE — Progress Notes (Signed)
[name], NP   Chief Complaint  Patient presents with  . Contraception    Mirenal removal, wants new BC    HPI:      [name] is a 28 y.o. G1P1001 who LMP was No LMP recorded. (Menstrual status: IUD)., presents today for IUD removal. Pt had placed in 2018 and is amenorrheic. Has issues with recurrent BV and thinks it's related to IUD. Didn't have BV issues until it was replaced 2018. Pt has had cultures in past that showed 4 types of bacteria for BV. Pt's sx resolve with clindamycin Rx. Pt also doing boric acid supp, yogurt, and probiotics with sx recurrence. Sx triggered by sex in past but hasn't been sex active and still getting sx. Pt had sx last wk that improved with clindamycin use.   Pt interested in trying depo. Did OCPs in past but couldn't remember to take daily. Gets adequate calcium daily.  Pt with hx of migraines without aura, no hx of HTN, DVTs. Annual/pap due 9/20.   Past Medical History:  Diagnosis Date  . BV (bacterial vaginosis)   . Hip dislocation, right (HCC)    x2  . Migraine without aura     History reviewed. No pertinent surgical history.  Family History  Problem Relation Age of Onset  . Diabetes Mellitus II Maternal Grandmother   . Diabetes Mellitus II Maternal Grandfather     Social History   Socioeconomic History  . Marital status: Single    Spouse name: Not on file  . Number of children: Not on file  . Years of education: Not on file  . Highest education level: Not on file  Occupational History  . Not on file  Social Needs  . Financial resource strain: Not on file  . Food insecurity:    Worry: Not on file    Inability: Not on file  . Transportation needs:    Medical: Not on file    Non-medical: Not on file  Tobacco Use  . Smoking status: Former Games developer  . Smokeless tobacco: Never Used  Substance and Sexual Activity  . Alcohol use: No    Frequency: Never  . Drug use: No    Comment: H/o drug treatment    . Sexual activity: Not Currently    Birth control/protection: I.U.D.    Comment: Mirena  Lifestyle  . Physical activity:    Days per week: Not on file    Minutes per session: Not on file  . Stress: Not on file  Relationships  . Social connections:    Talks on phone: Not on file    Gets together: Not on file    Attends religious service: Not on file    Active member of club or organization: Not on file    Attends meetings of clubs or organizations: Not on file    Relationship status: Not on file  . Intimate partner violence:    Fear of current or ex partner: Not on file    Emotionally abused: Not on file    Physically abused: Not on file    Forced sexual activity: Not on file  Other Topics Concern  . Not on file  Social History Narrative  . Not on file    Outpatient Medications Prior to Visit  Medication Sig Dispense Refill  . celecoxib (CELEBREX) 200 MG capsule Take 1 capsule (200 mg total) by mouth daily. 30 capsule 3  . clindamycin (CLEOCIN) 300 MG capsule Take 1  capsule (300 mg total) by mouth 2 (two) times daily for 7 days. 14 capsule 0  . gabapentin (NEURONTIN) 300 MG capsule Take 300 mg by mouth 3 (three) times daily.  1  . SUBOXONE 8-2 MG FILM Place 1.5 Film under the tongue daily.  0  . levonorgestrel (MIRENA, 52 MG,) 20 MCG/24HR IUD by Intrauterine route.    . meloxicam (MOBIC) 15 MG tablet Take 15 mg by mouth daily.     No facility-administered medications prior to visit.     ROS:  Review of Systems  Constitutional: Negative for fever.  Gastrointestinal: Negative for blood in stool, constipation, diarrhea, nausea and vomiting.  Genitourinary: Positive for vaginal discharge. Negative for dyspareunia, dysuria, flank pain, frequency, hematuria, urgency, vaginal bleeding and vaginal pain.  Musculoskeletal: Negative for back pain.  Skin: Negative for rash.    OBJECTIVE:   Vitals:  BP 100/70   Pulse 69   Ht 5\' 1"  (1.549 m)   Wt 159 lb (72.1 kg)   BMI  30.04 kg/m   Physical Exam Vitals signs reviewed.  Constitutional:      Appearance: She is well-developed.  Pulmonary:     Effort: Pulmonary effort is normal.  Genitourinary:    General: Normal vulva.     Pubic Area: No rash.      Labia:        Right: No rash, tenderness or lesion.        Left: No rash, tenderness or lesion.      Vagina: Normal. No vaginal discharge, erythema or tenderness.     Cervix: Normal.     Uterus: Normal. Not enlarged and not tender.      Adnexa: Right adnexa normal and left adnexa normal.       Right: No mass or tenderness.         Left: No mass or tenderness.       Comments: IUD STRINGS IN CX OS Musculoskeletal: Normal range of motion.  Skin:    General: Skin is warm.  Neurological:     General: No focal deficit present.     Mental Status: She is alert and oriented to person, place, and time.  Psychiatric:        Mood and Affect: Mood normal.        Behavior: Behavior normal.        Thought Content: Thought content normal.        Judgment: Judgment normal.     IUD Removal Strings of IUD identified and grasped.  IUD removed without problem with ring forceps.  Pt tolerated this well.  IUD noted to be intact.   Assessment/Plan: Encounter for IUD removal  Encounter for initial prescription of injectable contraceptive - Depo discussed. Rx eRxd. Condoms. Cont calcium. RTO wiht RN tomorrow for 1st inj. - Plan: medroxyPROGESTERone Acetate 150 MG/ML SUSY    Meds ordered this encounter  Medications  . medroxyPROGESTERone Acetate 150 MG/ML SUSY    Sig: Inject 1 mL (150 mg total) into the muscle once for 1 dose.    Dispense:  1 Syringe    Refill:  1    Order Specific Question:   Supervising Provider    Answer:   Nadara Mustard [[id number]]      Return in about 1 day (around 08/14/2018) for depo shot with RN.  Alicia B. Copland, PA-C 08/13/2018 9:34 AM

## 2018-08-14 ENCOUNTER — Ambulatory Visit: Payer: Medicaid Other

## 2018-08-16 ENCOUNTER — Telehealth: Payer: Self-pay

## 2018-08-16 ENCOUNTER — Ambulatory Visit
Admission: RE | Admit: 2018-08-16 | Discharge: 2018-08-16 | Disposition: A | Discharge status: Home / Self Care | Payer: Medicaid Other | Source: Ambulatory Visit | Attending: Orthopedic Surgery | Admitting: Orthopedic Surgery

## 2018-08-16 ENCOUNTER — Other Ambulatory Visit: Payer: Self-pay

## 2018-08-16 DIAGNOSIS — Q6589 Other specified congenital deformities of hip: Secondary | ICD-10-CM | POA: Diagnosis not present

## 2018-08-16 DIAGNOSIS — G8929 Other chronic pain: Secondary | ICD-10-CM | POA: Insufficient documentation

## 2018-08-16 DIAGNOSIS — M25551 Pain in right hip: Secondary | ICD-10-CM | POA: Insufficient documentation

## 2018-08-16 NOTE — Telephone Encounter (Signed)
Pt has lost her clindamycin.  Can rx be sent to [facility] in [place]?  [phone]

## 2018-08-18 ENCOUNTER — Encounter: Payer: Self-pay | Admitting: Obstetrics and Gynecology

## 2018-08-18 ENCOUNTER — Other Ambulatory Visit: Payer: Self-pay | Admitting: Obstetrics and Gynecology

## 2018-08-18 DIAGNOSIS — N760 Acute vaginitis: Principal | ICD-10-CM

## 2018-08-18 DIAGNOSIS — B9689 Other specified bacterial agents as the cause of diseases classified elsewhere: Secondary | ICD-10-CM

## 2018-08-18 MED ORDER — CLINDAMYCIN HCL 300 MG PO CAPS: 300.0000 mg | ORAL_CAPSULE | Freq: Two times a day (BID) | ORAL | 0 refills | Status: AC

## 2018-08-18 MED ORDER — NORETHIN ACE-ETH ESTRAD-FE 1-20 MG-MCG(24) PO TABS: 1.0000 | ORAL_TABLET | Freq: Every day | ORAL | 1 refills | Status: DC

## 2018-08-18 NOTE — Progress Notes (Signed)
Rx OCPs. Start today. Condoms for 1 mo. Decided against depo after IUD removal.

## 2018-08-18 NOTE — Telephone Encounter (Signed)
Rx RF eRxd. Message sent to pt.

## 2018-08-18 NOTE — Progress Notes (Signed)
Rx RF cleocin since pt lost Rx for BV.

## 2018-09-04 ENCOUNTER — Other Ambulatory Visit: Payer: Self-pay

## 2018-09-04 ENCOUNTER — Observation Stay
Admission: EM | Admit: 2018-09-04 | Discharge: 2018-09-05 | Disposition: A | Discharge status: Home / Self Care | Payer: Medicaid Other | Attending: Internal Medicine | Admitting: Internal Medicine

## 2018-09-04 ENCOUNTER — Encounter: Payer: Self-pay | Admitting: *Deleted

## 2018-09-04 DIAGNOSIS — Z915 Personal history of self-harm: Secondary | ICD-10-CM | POA: Insufficient documentation

## 2018-09-04 DIAGNOSIS — F1310 Sedative, hypnotic or anxiolytic abuse, uncomplicated: Secondary | ICD-10-CM | POA: Diagnosis present

## 2018-09-04 DIAGNOSIS — Z79899 Other long term (current) drug therapy: Secondary | ICD-10-CM | POA: Diagnosis not present

## 2018-09-04 DIAGNOSIS — R400 Somnolence: Secondary | ICD-10-CM | POA: Diagnosis not present

## 2018-09-04 DIAGNOSIS — Z793 Long term (current) use of hormonal contraceptives: Secondary | ICD-10-CM | POA: Insufficient documentation

## 2018-09-04 DIAGNOSIS — Z6830 Body mass index (BMI) 30.0-30.9, adult: Secondary | ICD-10-CM | POA: Diagnosis not present

## 2018-09-04 DIAGNOSIS — Z87891 Personal history of nicotine dependence: Secondary | ICD-10-CM | POA: Diagnosis not present

## 2018-09-04 DIAGNOSIS — F419 Anxiety disorder, unspecified: Secondary | ICD-10-CM | POA: Diagnosis not present

## 2018-09-04 DIAGNOSIS — E669 Obesity, unspecified: Secondary | ICD-10-CM | POA: Insufficient documentation

## 2018-09-04 DIAGNOSIS — F1110 Opioid abuse, uncomplicated: Secondary | ICD-10-CM | POA: Diagnosis not present

## 2018-09-04 DIAGNOSIS — F32A Depression, unspecified: Secondary | ICD-10-CM | POA: Diagnosis present

## 2018-09-04 DIAGNOSIS — Z791 Long term (current) use of non-steroidal anti-inflammatories (NSAID): Secondary | ICD-10-CM | POA: Diagnosis not present

## 2018-09-04 DIAGNOSIS — T424X4A Poisoning by benzodiazepines, undetermined, initial encounter: Secondary | ICD-10-CM | POA: Diagnosis present

## 2018-09-04 DIAGNOSIS — T424X1A Poisoning by benzodiazepines, accidental (unintentional), initial encounter: Principal | ICD-10-CM

## 2018-09-04 DIAGNOSIS — F329 Major depressive disorder, single episode, unspecified: Secondary | ICD-10-CM | POA: Diagnosis not present

## 2018-09-04 LAB — COMPREHENSIVE METABOLIC PANEL
ALT: 28 U/L (ref 0–44)
AST: 21 U/L (ref 15–41)
Albumin: 4.3 g/dL (ref 3.5–5.0)
Alkaline Phosphatase: 54 U/L (ref 38–126)
Anion gap: 10 (ref 5–15)
BUN: 14 mg/dL (ref 6–20)
CO2: 24 mmol/L (ref 22–32)
Calcium: 9.1 mg/dL (ref 8.9–10.3)
Chloride: 106 mmol/L (ref 98–111)
Creatinine, Ser: 0.58 mg/dL (ref 0.44–1.00)
GFR calc Af Amer: >60 mL/min (ref >60)
GFR calc non Af Amer: >60 mL/min (ref >60)
Glucose, Bld: 108 mg/dL — ABNORMAL HIGH (ref 70–99)
Potassium: 4.3 mmol/L (ref 3.5–5.1)
Sodium: 140 mmol/L (ref 135–145)
Total Bilirubin: 0.6 mg/dL (ref 0.3–1.2)
Total Protein: 7.5 g/dL (ref 6.5–8.1)

## 2018-09-04 LAB — URINALYSIS, COMPLETE (UACMP) WITH MICROSCOPIC
Bacteria, UA: NONE SEEN
Bilirubin Urine: NEGATIVE
Glucose, UA: NEGATIVE mg/dL
Hgb urine dipstick: NEGATIVE
Ketones, ur: NEGATIVE mg/dL
Leukocytes,Ua: NEGATIVE
Nitrite: NEGATIVE
Protein, ur: NEGATIVE mg/dL
Specific Gravity, Urine: 1.02 (ref 1.005–1.030)
WBC, UA: NONE SEEN WBC/hpf (ref 0–5)
pH: 5 (ref 5.0–8.0)

## 2018-09-04 LAB — URINE DRUG SCREEN, QUALITATIVE (ARMC ONLY)
Amphetamines, Ur Screen: NOT DETECTED
Barbiturates, Ur Screen: NOT DETECTED
Benzodiazepine, Ur Scrn: POSITIVE — AB
Cannabinoid 50 Ng, Ur ~~LOC~~: NOT DETECTED
Cocaine Metabolite,Ur ~~LOC~~: NOT DETECTED
MDMA (Ecstasy)Ur Screen: NOT DETECTED
Methadone Scn, Ur: NOT DETECTED
Opiate, Ur Screen: NOT DETECTED
Phencyclidine (PCP) Ur S: NOT DETECTED
Tricyclic, Ur Screen: NOT DETECTED

## 2018-09-04 LAB — ETHANOL: Alcohol, Ethyl (B): <10 mg/dL (ref <10)

## 2018-09-04 LAB — CBC
HCT: 41.8 % (ref 36.0–46.0)
Hemoglobin: 13.9 g/dL (ref 12.0–15.0)
MCH: 29.6 pg (ref 26.0–34.0)
MCHC: 33.3 g/dL (ref 30.0–36.0)
MCV: 88.9 fL (ref 80.0–100.0)
Platelets: 234 10*3/uL (ref 150–400)
RBC: 4.7 MIL/uL (ref 3.87–5.11)
RDW: 12.4 % (ref 11.5–15.5)
WBC: 11.5 10*3/uL — ABNORMAL HIGH (ref 4.0–10.5)
nRBC: 0 % (ref 0.0–0.2)

## 2018-09-04 LAB — POCT PREGNANCY, URINE: Preg Test, Ur: NEGATIVE

## 2018-09-04 MED ORDER — ONDANSETRON HCL 4 MG/2ML IJ SOLN
4.0000 mg | Freq: Once | INTRAMUSCULAR | Status: AC
  Administered 2018-09-04: 23:00:00 4 mg via INTRAVENOUS
  Filled 2018-09-04: qty 2

## 2018-09-04 MED ORDER — NALOXONE HCL 2 MG/2ML IJ SOSY
PREFILLED_SYRINGE | INTRAMUSCULAR | Status: AC
  Administered 2018-09-04: 0.4 mg via INTRAVENOUS
  Filled 2018-09-04: qty 2

## 2018-09-04 MED ORDER — NALOXONE HCL 2 MG/2ML IJ SOSY
0.4000 mg | PREFILLED_SYRINGE | Freq: Once | INTRAMUSCULAR | Status: AC
  Administered 2018-09-04: 0.4 mg via INTRAVENOUS

## 2018-09-04 MED ORDER — SODIUM CHLORIDE 0.9 % IV BOLUS
1000.0000 mL | Freq: Once | INTRAVENOUS | Status: AC
  Administered 2018-09-04: 1000 mL via INTRAVENOUS

## 2018-09-04 NOTE — ED Notes (Addendum)
Pt remains lethargic. This RN only able to wake pt when pain applied. Given warm blankets as requested. Speech almost incomprehensible.

## 2018-09-04 NOTE — ED Provider Notes (Signed)
[facility] Emergency Department Provider Note  ____________________________________________  Time seen: Approximately 10:32 PM  I have reviewed the triage vital signs and the nursing notes.   HISTORY  Chief Complaint Altered Mental Status    HPI Kristine Griffin is a 28 y.o. female with a history of polysubstance abuse and overdose brought by police for overdose.  The patient is unable to give me any history because she is too somnolent.  The police officer reports to me that she was pulled over for erratic driving, was alert and awake and was being brought to jail and by the time she arrived in jail she was mostly unresponsive.  Upon arrival to the emergency department, she is extremely somnolent with reassuring vital signs and protecting her airway but requires painful stimulus for arousal.  Past Medical History:  Diagnosis Date  . BV (bacterial vaginosis)   . Hip dislocation, right (HCC)    x2  . Migraine without aura     Patient Active Problem List   Diagnosis Date Noted  . Overdose of muscle relaxant 02/10/2016  . Opiate abuse, continuous (HCC) 02/10/2016  . 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 Opiate abuse, episodic (HCC) 10/27/2014  . Cocaine abuse (HCC) 10/27/2014  . Hip pain 10/27/2014  . Depression 10/27/2014  . Back pain 04/15/2012    No past surgical history on file.  Current Outpatient Rx  . Order #: [id number] Class: Normal  . Order #: [id number] Class: Historical Med  . Order #: [id number] Class: Normal  . Order #: [id number] Class: Historical Med    Allergies Patient has no known allergies.  Family History  Problem Relation Age of Onset  . Diabetes Mellitus II Maternal Grandmother   . Diabetes Mellitus II Maternal Grandfather     Social History Social History   Tobacco Use  . Smoking status: Former Games developer  . Smokeless tobacco: Never Used  Substance Use Topics  . Alcohol use: No    Frequency: Never  . Drug use: Yes     Comment: H/o drug treatment    Review of Systems Able to obtain due to patient unresponsiveness.   ____________________________________________   PHYSICAL EXAM:  VITAL SIGNS: ED Triage Vitals  Enc Vitals Group     BP 09/04/18 2116 107/73     Pulse Rate 09/04/18 2116 80     Resp 09/04/18 2116 16     Temp 09/04/18 2116 97.9 F (36.6 C)     Temp Source 09/04/18 2116 Oral     SpO2 09/04/18 2116 98 %     Weight 09/04/18 2117 160 lb (72.6 kg)     Height 09/04/18 2117 5\' 1"  (1.549 m)     Head Circumference --      Peak Flow --      Pain Score --      Pain Loc --      Pain Edu? --      Excl. in GC? --     Constitutional: Patient is somnolent, and moves with painful stimulus but otherwise does not give any verbal responses. Eyes: Conjunctivae are normal.  EOMI. pupils are 2 mm, symmetric and responsive bilaterally.  No scleral icterus. Head: Atraumatic. Nose: No congestion/rhinnorhea. Mouth/Throat: Mucous membranes are mildly dry.  Neck: No stridor.  Supple.  No JVD.  No meningismus. Cardiovascular: Normal rate, regular rhythm. No murmurs, rubs or gallops.  Respiratory: Normal respiratory effort.  No accessory muscle use or retractions. Lungs CTAB.  No wheezes, rales or ronchi. Gastrointestinal: Soft, nontender and  nondistended.  No guarding or rebound.  No peritoneal signs. Musculoskeletal: No LE edema.  Neurologic: Unresponsive except to painful stimulus. Skin:  Skin is warm, dry and intact. No rash noted. Psychiatric: Able to assess due to unresponsiveness.  ____________________________________________   LABS (all labs ordered are listed, but only abnormal results are displayed)  Labs Reviewed  COMPREHENSIVE METABOLIC PANEL - Abnormal; Notable for the following components:      Result Value   Glucose, Bld 108 (*)    All other components within normal limits  CBC - Abnormal; Notable for the following components:   WBC 11.5 (*)    All other components within normal  limits  ETHANOL  URINE DRUG SCREEN, QUALITATIVE ([facility] ONLY)  URINALYSIS, COMPLETE ([facility]) WITH MICROSCOPIC  CBG MONITORING, ED  POC URINE PREG, ED  POCT PREGNANCY, URINE   ____________________________________________  EKG  ED ECG REPORT I, Anne-Caroline Sharma Covert, the attending physician, personally viewed and interpreted this ECG.   Date: 09/04/2018  EKG Time: 2119  Rate: 82  Rhythm: normal sinus rhythm  Axis: normal  Intervals:none  ST&T Change: No STEMI  ____________________________________________  RADIOLOGY  No results found.  ____________________________________________   PROCEDURES  Procedure(s) performed: None  Procedures  Critical Care performed: Yes, see critical care note(s) ____________________________________________   INITIAL IMPRESSION / ASSESSMENT AND PLAN / ED COURSE  Pertinent labs & imaging results that were available during my care of the patient were reviewed by me and considered in my medical decision making (see chart for details).  28 y.o. female with a history of opioid and cocaine abuse, prior overdose, presenting with acute somnolence after being pulled over for erratic driving.  Overall, her history is concerning for substance abuse with overdose.  We will try some Narcan to see if she wakes up with this.  I am awaiting her urine test but her blood work is reassuring; she does not have electrolyte imbalance or anemia.  Her alcohol level is less than 10.  Plan reevaluation for final disposition.  ----------------------------------------- 11:17 PM on 09/04/2018 -----------------------------------------  Patient did wake up with a low-dose of Narcan and then went back to sleep.  Because she is protecting her airway we will continue to let her sober up from her opioid overdose.  The patient will be discharged to police custody.  Patient has been signed out to Dr. Manson Passey for final disposition.  CRITICAL CARE Performed by: Rockne Menghini   Total critical care time: 35 minutes  Critical care time was exclusive of separately billable procedures and treating other patients.  Critical care was necessary to treat or prevent imminent or life-threatening deterioration.  Critical care was time spent personally by me on the following activities: development of treatment plan with patient and/or surrogate as well as nursing, discussions with consultants, evaluation of patient's response to treatment, examination of patient, obtaining history from patient or surrogate, ordering and performing treatments and interventions, ordering and review of laboratory studies, ordering and review of radiographic studies, pulse oximetry and re-evaluation of patient's condition.   ____________________________________________  FINAL CLINICAL IMPRESSION(S) / ED DIAGNOSES  Final diagnoses:  Opioid overdose, undetermined intent, initial encounter (HCC)         NEW MEDICATIONS STARTED DURING THIS VISIT:  New Prescriptions   No medications on file      Rockne Menghini, MD 09/04/18 2318

## 2018-09-04 NOTE — ED Notes (Signed)
Pt repositioned and sitting up in bed, pt still very drowsy and hard to arouse. Slurred speech. MD made aware

## 2018-09-04 NOTE — Discharge Instructions (Signed)
Return to the emergency department if you develop thoughts of hurting yourself or anyone else, hallucinations, or any other symptoms concerning to you. °

## 2018-09-04 NOTE — ED Triage Notes (Addendum)
Pt brought in by [place] police dept.  Pt was pulled over for DUI and blew a 0.  Pt states no drug use.  Pt has slurred speech and is sleepy.  Pt states she took suboxone.

## 2018-09-05 ENCOUNTER — Inpatient Hospital Stay
Admission: AD | Admit: 2018-09-05 | Discharge: 2018-09-06 | DRG: 918 | Disposition: A | Discharge status: Home / Self Care | Payer: Medicaid Other | Source: Intra-hospital | Attending: Psychiatry | Admitting: Psychiatry

## 2018-09-05 ENCOUNTER — Other Ambulatory Visit: Payer: Self-pay

## 2018-09-05 ENCOUNTER — Encounter: Payer: Self-pay | Admitting: Psychiatry

## 2018-09-05 DIAGNOSIS — T424X1A Poisoning by benzodiazepines, accidental (unintentional), initial encounter: Principal | ICD-10-CM

## 2018-09-05 DIAGNOSIS — F1310 Sedative, hypnotic or anxiolytic abuse, uncomplicated: Secondary | ICD-10-CM

## 2018-09-05 DIAGNOSIS — Z833 Family history of diabetes mellitus: Secondary | ICD-10-CM | POA: Diagnosis not present

## 2018-09-05 DIAGNOSIS — X58XXXA Exposure to other specified factors, initial encounter: Secondary | ICD-10-CM | POA: Diagnosis present

## 2018-09-05 DIAGNOSIS — Z79899 Other long term (current) drug therapy: Secondary | ICD-10-CM | POA: Diagnosis not present

## 2018-09-05 DIAGNOSIS — F1120 Opioid dependence, uncomplicated: Secondary | ICD-10-CM | POA: Diagnosis present

## 2018-09-05 DIAGNOSIS — F17200 Nicotine dependence, unspecified, uncomplicated: Secondary | ICD-10-CM | POA: Diagnosis present

## 2018-09-05 DIAGNOSIS — F332 Major depressive disorder, recurrent severe without psychotic features: Secondary | ICD-10-CM

## 2018-09-05 DIAGNOSIS — Z975 Presence of (intrauterine) contraceptive device: Secondary | ICD-10-CM | POA: Diagnosis not present

## 2018-09-05 DIAGNOSIS — G8929 Other chronic pain: Secondary | ICD-10-CM | POA: Diagnosis present

## 2018-09-05 DIAGNOSIS — T1491XA Suicide attempt, initial encounter: Secondary | ICD-10-CM | POA: Insufficient documentation

## 2018-09-05 DIAGNOSIS — F1110 Opioid abuse, uncomplicated: Secondary | ICD-10-CM | POA: Diagnosis not present

## 2018-09-05 DIAGNOSIS — F419 Anxiety disorder, unspecified: Secondary | ICD-10-CM | POA: Diagnosis present

## 2018-09-05 DIAGNOSIS — G629 Polyneuropathy, unspecified: Secondary | ICD-10-CM | POA: Diagnosis present

## 2018-09-05 DIAGNOSIS — M25559 Pain in unspecified hip: Secondary | ICD-10-CM | POA: Diagnosis present

## 2018-09-05 LAB — SALICYLATE LEVEL: Salicylate Lvl: <7 mg/dL (ref 2.8–30.0)

## 2018-09-05 LAB — TSH: TSH: 3.309 u[IU]/mL (ref 0.350–4.500)

## 2018-09-05 LAB — ACETAMINOPHEN LEVEL: Acetaminophen (Tylenol), Serum: <10 ug/mL — ABNORMAL LOW (ref 10–30)

## 2018-09-05 MED ORDER — ACETAMINOPHEN 325 MG PO TABS: 650.0000 mg | ORAL_TABLET | Freq: Four times a day (QID) | ORAL | Status: DC | PRN

## 2018-09-05 MED ORDER — ACETAMINOPHEN 325 MG PO TABS
650.0000 mg | ORAL_TABLET | Freq: Four times a day (QID) | ORAL | Status: DC | PRN
  Filled 2018-09-05: qty 2

## 2018-09-05 MED ORDER — ONDANSETRON HCL 4 MG PO TABS: 4.0000 mg | ORAL_TABLET | Freq: Four times a day (QID) | ORAL | Status: DC | PRN

## 2018-09-05 MED ORDER — ONDANSETRON HCL 4 MG/2ML IJ SOLN
4.0000 mg | Freq: Four times a day (QID) | INTRAMUSCULAR | Status: DC | PRN
  Filled 2018-09-05: qty 2

## 2018-09-05 MED ORDER — ONDANSETRON HCL 4 MG/2ML IJ SOLN: 4.0000 mg | Freq: Four times a day (QID) | INTRAMUSCULAR | Status: DC | PRN

## 2018-09-05 MED ORDER — SODIUM CHLORIDE 0.9 % IV SOLN
INTRAVENOUS | Status: DC
  Administered 2018-09-05: 06:00:00 via INTRAVENOUS

## 2018-09-05 MED ORDER — NORETHIN ACE-ETH ESTRAD-FE 1-20 MG-MCG(24) PO TABS: 1.0000 | ORAL_TABLET | Freq: Every day | ORAL | Status: DC

## 2018-09-05 MED ORDER — NALOXONE HCL 2 MG/2ML IJ SOSY
1.0000 mg | PREFILLED_SYRINGE | Freq: Once | INTRAMUSCULAR | Status: AC
  Administered 2018-09-05: 1 mg via INTRAVENOUS

## 2018-09-05 MED ORDER — ONDANSETRON HCL 4 MG PO TABS
4.0000 mg | ORAL_TABLET | Freq: Four times a day (QID) | ORAL | Status: DC | PRN
  Filled 2018-09-05: qty 1

## 2018-09-05 MED ORDER — ACETAMINOPHEN 650 MG RE SUPP
650.0000 mg | Freq: Four times a day (QID) | RECTAL | Status: DC | PRN
  Filled 2018-09-05: qty 1

## 2018-09-05 MED ORDER — BUPRENORPHINE HCL-NALOXONE HCL 8-2 MG SL FILM
2.0000 | ORAL_FILM | Freq: Every day | SUBLINGUAL | Status: DC
  Filled 2018-09-05: qty 2

## 2018-09-05 MED ORDER — ACETAMINOPHEN 650 MG RE SUPP: 650.0000 mg | Freq: Four times a day (QID) | RECTAL | Status: DC | PRN

## 2018-09-05 MED ORDER — SODIUM CHLORIDE 0.9 % IV BOLUS
1000.0000 mL | Freq: Once | INTRAVENOUS | Status: AC
  Administered 2018-09-05: 01:00:00 1000 mL via INTRAVENOUS

## 2018-09-05 MED ORDER — ENOXAPARIN SODIUM 40 MG/0.4ML ~~LOC~~ SOLN: 40.0000 mg | SUBCUTANEOUS | Status: DC

## 2018-09-05 MED ORDER — CELECOXIB 200 MG PO CAPS
200.0000 mg | ORAL_CAPSULE | Freq: Every day | ORAL | Status: DC
  Administered 2018-09-06: 200 mg via ORAL
  Filled 2018-09-05: qty 1

## 2018-09-05 MED ORDER — ALUM & MAG HYDROXIDE-SIMETH 200-200-20 MG/5ML PO SUSP: 30.0000 mL | ORAL | Status: DC | PRN

## 2018-09-05 MED ORDER — NICOTINE POLACRILEX 2 MG MT GUM: 2.0000 mg | CHEWING_GUM | OROMUCOSAL | Status: DC | PRN

## 2018-09-05 MED ORDER — DOCUSATE SODIUM 100 MG PO CAPS
100.0000 mg | ORAL_CAPSULE | Freq: Two times a day (BID) | ORAL | Status: DC
  Filled 2018-09-05 (×2): qty 1

## 2018-09-05 MED ORDER — NICOTINE 14 MG/24HR TD PT24
14.0000 mg | MEDICATED_PATCH | Freq: Every day | TRANSDERMAL | Status: DC
  Administered 2018-09-05: 14 mg via TRANSDERMAL
  Filled 2018-09-05: qty 1

## 2018-09-05 MED ORDER — DOCUSATE SODIUM 100 MG PO CAPS
100.0000 mg | ORAL_CAPSULE | Freq: Two times a day (BID) | ORAL | Status: DC
  Filled 2018-09-05: qty 1

## 2018-09-05 MED ORDER — MAGNESIUM HYDROXIDE 400 MG/5ML PO SUSP: 30.0000 mL | Freq: Every day | ORAL | Status: DC | PRN

## 2018-09-05 MED ORDER — NICOTINE POLACRILEX 2 MG MT GUM
2.0000 mg | CHEWING_GUM | OROMUCOSAL | Status: DC | PRN
  Filled 2018-09-05: qty 1

## 2018-09-05 MED ORDER — NICOTINE 14 MG/24HR TD PT24: 14.0000 mg | MEDICATED_PATCH | Freq: Every day | TRANSDERMAL | Status: DC

## 2018-09-05 MED ORDER — GABAPENTIN 300 MG PO CAPS
300.0000 mg | ORAL_CAPSULE | Freq: Three times a day (TID) | ORAL | Status: DC
  Administered 2018-09-05 (×2): 300 mg via ORAL
  Filled 2018-09-05 (×2): qty 1

## 2018-09-05 MED ORDER — BUPRENORPHINE HCL-NALOXONE HCL 8-2 MG SL FILM
2.0000 | ORAL_FILM | Freq: Every day | SUBLINGUAL | Status: DC
  Administered 2018-09-05: 2 via SUBLINGUAL
  Filled 2018-09-05: qty 2

## 2018-09-05 MED ORDER — NALOXONE HCL 2 MG/2ML IJ SOSY
PREFILLED_SYRINGE | INTRAMUSCULAR | Status: AC
  Filled 2018-09-05: qty 2

## 2018-09-05 MED ORDER — GABAPENTIN 300 MG PO CAPS
300.0000 mg | ORAL_CAPSULE | Freq: Three times a day (TID) | ORAL | Status: DC
  Administered 2018-09-06: 300 mg via ORAL
  Filled 2018-09-05: qty 1

## 2018-09-05 NOTE — Consult Note (Signed)
[facility] Face-to-Face Psychiatry Consult   Reason for Consult:  overdose Referring Physician:  Dr. Juliene Pina Patient Identification: Kristine Griffin MRN:  [id number] Principal Diagnosis: Overdose of benzodiazepine, accidental or unintentional, initial encounter Diagnosis:  Principal Problem:   Overdose of benzodiazepine, accidental or unintentional, initial encounter Active Problems:   Opiate abuse, episodic (HCC)   Depression   Benzodiazepine abuse (HCC)  Patient is seen, chart is reviewed, collateral obtained from patient's grandmother, with whom she lives. Total Time spent with patient: 1.5 hours   Subjective: "I have worked so hard, and now I failed.  The only reason I stay alive is for my son."   HPI:  Kristine Griffin is a 28 y.o. female patient admitted with altered mental status.   The patient with past medical history of benzodiazepine abuse, anxiety and depression presents to the emergency department after being pulled over by the police for driving erratically.  Her speech was slurred and the patient appeared very somnolent.  By the time she arrived to jail she was nearly unresponsive.  She was given Narcan without improvement.  Psychology screen revealed positive benzodiazepine metabolites.  The patient's oxygen saturations were normal on room air and she maintained her airway throughout emergency department evaluation.  Due to continued somnolence and minimal responsiveness the emergency department staff called the hospitalist service for admission.  On evaluation, patient is awake, alert, and oriented to current time and events.  She denies any recall of events last evening.  She reports that she was not intending to use any substances, and does not remember using any substances.  She does not remember being pulled over by the police.  She is accusing hospital staff of having her phone charger in her later.  Patient minimizes that this was a suicide attempt, reporting that she lives for  her son.  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  Patient states that she has a Therapist, sports through [employer], [employer] and works with a support group 5 days a week with [employer].  She reports that she has been working with the rehabilitation program since June 2018.  She reports that she maintained sobriety for 1 year and 5 months and then relapsed for 2 months.  She states that she then has remained sober for the past 7 months, but reports "I just could not handle the stress anymore."  Patient admits that she had a break-up with her boyfriend of 5 years, and that she misses him and his 3 children which have been like children to her.  Patient does endorse a psychiatric admission in 2018 after she broke up with her sons father.  She reports that she was admitted for suicidal ideation at that time but denies suicide attempt at that time as well.  Patient is crying throughout interview, making statements like "I failed after I have worked so hard."  Patient has been making comments to nursing staff as well as to this Clinical research associate that the only reason she even tries to stay alive is for her son.  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 She denies symptoms of depression or anxiety, blaming "stress, and nobody understands how hard I try."  Patient reports that she has not been  sleeping well, she has not been eating well, she has been having difficulty with focus and attention, and states she has been "pushing herself so hard to try to be successful, and "no one gives me any credit, I only had one chance and now I will be kicked out of my rehab program and never get to be a peer support counselor."  Past Psychiatric History: Multiple hospitalizations for accidental overdose, history of  self-mutilation. She says she is not currently taking any medicine for depression or anxiety. She denies that she's ever tried to kill her self.  Risk to Self:  Yes Risk to Others:  Yes, driving under the influence Prior Inpatient Therapy:   3 years ago admitted for making suicidal statements after break-up with baby's father at [facility]; patient has never done residential substance use treatment. Prior Outpatient Therapy:  Patient is active in substance use rehabilitation therapy through [facility].  She does not have a psychiatrist for mood disorders or a therapist.  Collateral obtained from patient's grandmother [name] [phone]): Grandmother states that patient has been going through a break-up with her significant other of the past 5 years and missing his 3 children.  She has been concerned that for the past 3 days patient has been, out of it, looking around the house for pills."  She knows the patient went to work yesterday but never came home, and they were not surprised that they were notified by the hospital that she was here.  Grandmother reports that patient's son is currently staying with his father.  She denies any concerns for patient's sons safety or care.  At this time they express that they do not want patient to return living with them.  They would like to keep patient's son with them while "patient works on herself."  Past Medical History:  Past Medical History:  Diagnosis Date  . BV (bacterial vaginosis)   . Hip dislocation, right (HCC)    x2  . Migraine without aura    No past surgical history on file. Family History:  Family History  Problem Relation Age of Onset  . Diabetes Mellitus II Maternal Grandmother   . Diabetes Mellitus II Maternal Grandfather    Family Psychiatric  History: Substance abuse and mood instability   Social History:  Social History   Substance and Sexual Activity  Alcohol Use No  . Frequency: Never     Social History   Substance and  Sexual Activity  Drug Use Yes   Comment: H/o drug treatment    Social History   Socioeconomic History  . Marital status: Single    Spouse name: Not on file  . Number of children: Not on file  . Years of education: Not on file  . Highest education level: Not on file  Occupational History  . Not on file  Social Needs  . Financial resource strain: Not on file  . Food insecurity:    Worry: Not on file    Inability: Not on file  . Transportation needs:    Medical: Not on file    Non-medical: Not on file  Tobacco Use  . Smoking status: Current Every Day Smoker  . Smokeless tobacco: Never Used  Substance and Sexual Activity  . Alcohol use: No    Frequency: Never  . Drug use: Yes    Comment: H/o drug treatment  . Sexual activity: Not Currently    Birth control/protection: I.U.D.    Comment: Mirena  Lifestyle  . Physical activity:  Days per week: Not on file    Minutes per session: Not on file  . Stress: Not on file  Relationships  . Social connections:    Talks on phone: Not on file    Gets together: Not on file    Attends religious service: Not on file    Active member of club or organization: Not on file    Attends meetings of clubs or organizations: Not on file    Relationship status: Not on file  Other Topics Concern  . Not on file  Social History Narrative  . Not on file   Additional Social History: Lives with [place]- Fayrene FearingJames and Talbert ForestShirley Horner [phone])    reports that she has quit smoking. She has never used smokeless tobacco. She reports current drug use. She reports that she does not drink alcohol.  Allergies:  No Known Allergies  Labs:  Results for orders placed or performed during the hospital encounter of 09/04/18 (from the past 48 hour(s))  Comprehensive metabolic panel     Status: Abnormal   Collection Time: 09/04/18  9:21 PM  Result Value Ref Range   Sodium 140 135 - 145 mmol/L   Potassium 4.3 3.5 - 5.1 mmol/L   Chloride 106 98 -  111 mmol/L   CO2 24 22 - 32 mmol/L   Glucose, Bld 108 (H) 70 - 99 mg/dL   BUN 14 6 - 20 mg/dL   Creatinine, Ser 9.140.58 0.44 - 1.00 mg/dL   Calcium 9.1 8.9 - 78.210.3 mg/dL   Total Protein 7.5 6.5 - 8.1 g/dL   Albumin 4.3 3.5 - 5.0 g/dL   AST 21 15 - 41 U/L   ALT 28 0 - 44 U/L   Alkaline Phosphatase 54 38 - 126 U/L   Total Bilirubin 0.6 0.3 - 1.2 mg/dL   GFR calc non Af Amer >60 >60 mL/min   GFR calc Af Amer >60 >60 mL/min   Anion gap 10 5 - 15    Comment: Performed at [facility], [address] [place], [place] [address]  CBC     Status: Abnormal   Collection Time: 09/04/18  9:21 PM  Result Value Ref Range   WBC 11.5 (H) 4.0 - 10.5 K/uL   RBC 4.70 3.87 - 5.11 MIL/uL   Hemoglobin 13.9 12.0 - 15.0 g/dL   HCT 30.841.8 65.736.0 - 84.646.0 %   MCV 88.9 80.0 - 100.0 fL   MCH 29.6 26.0 - 34.0 pg   MCHC 33.3 30.0 - 36.0 g/dL   RDW 96.212.4 95.211.5 - 84.115.5 %   Platelets 234 150 - 400 K/uL   nRBC 0.0 0.0 - 0.2 %    Comment: Performed at [facility], [address] [place], [place] [address]  Ethanol     Status: None   Collection Time: 09/04/18  9:21 PM  Result Value Ref Range   Alcohol, Ethyl (B) <10 <10 mg/dL    Comment: (NOTE) Lowest detectable limit for serum alcohol is 10 mg/dL. For medical purposes only. Performed at [facility], [address] [place], [place] [address]   Acetaminophen level     Status: Abnormal   Collection Time: 09/04/18  9:21 PM  Result Value Ref Range   Acetaminophen (Tylenol), Serum <10 (L) 10 - 30 ug/mL    Comment: (NOTE) Therapeutic concentrations vary significantly. A range of 10-30 ug/mL  may be an effective concentration for many patients. However, some  are best treated at concentrations outside  of this range. Acetaminophen concentrations >150 ug/mL at 4 hours after ingestion  and >50 ug/mL at 12 hours after ingestion are often associated with  toxic reactions. Performed at [facility], [address]  [place], [place] [address]   Salicylate level     Status: None   Collection Time: 09/04/18  9:21 PM  Result Value Ref Range   Salicylate Lvl <7.0 2.8 - 30.0 mg/dL    Comment: Performed at [facility], [address] [place], [place] [address]  TSH     Status: None   Collection Time: 09/04/18  9:21 PM  Result Value Ref Range   TSH 3.309 0.350 - 4.500 uIU/mL    Comment: Performed by a 3rd Generation assay with a functional sensitivity of <=0.01 uIU/mL. Performed at [facility], [address], [place], [place] [address]   Urine Drug Screen, Qualitative [facility] only)     Status: Abnormal   Collection Time: 09/04/18 10:40 PM  Result Value Ref Range   Tricyclic, Ur Screen NONE DETECTED NONE DETECTED   Amphetamines, Ur Screen NONE DETECTED NONE DETECTED   MDMA (Ecstasy)Ur Screen NONE DETECTED NONE DETECTED   Cocaine Metabolite,Ur [place] NONE DETECTED NONE DETECTED   Opiate, Ur Screen NONE DETECTED NONE DETECTED   Phencyclidine (PCP) Ur S NONE DETECTED NONE DETECTED   Cannabinoid 50 Ng, Ur [place] NONE DETECTED NONE DETECTED   Barbiturates, Ur Screen NONE DETECTED NONE DETECTED   Benzodiazepine, Ur Scrn POSITIVE (A) NONE DETECTED   Methadone Scn, Ur NONE DETECTED NONE DETECTED    Comment: (NOTE) Tricyclics + metabolites, urine    Cutoff 1000 ng/mL Amphetamines + metabolites, urine  Cutoff 1000 ng/mL MDMA (Ecstasy), urine              Cutoff 500 ng/mL Cocaine Metabolite, urine          Cutoff 300 ng/mL Opiate + metabolites, urine        Cutoff 300 ng/mL Phencyclidine (PCP), urine         Cutoff 25 ng/mL Cannabinoid, urine                 Cutoff 50 ng/mL Barbiturates + metabolites, urine  Cutoff 200 ng/mL Benzodiazepine, urine              Cutoff 200 ng/mL Methadone, urine                   Cutoff 300 ng/mL The urine drug screen provides only a preliminary, unconfirmed analytical test result and should not be used for non-medical purposes. Clinical consideration and professional  judgment should be applied to any positive drug screen result due to possible interfering substances. A more specific alternate chemical method must be used in order to obtain a confirmed analytical result. Gas chromatography / mass spectrometry (GC/MS) is the preferred confirmat ory method. Performed at [facility], [address] [place], [place] [address]   Urinalysis, Complete w Microscopic     Status: Abnormal   Collection Time: 09/04/18 10:40 PM  Result Value Ref Range   Color, Urine AMBER (A) YELLOW    Comment: BIOCHEMICALS MAY BE AFFECTED BY COLOR   APPearance TURBID (A) CLEAR   Specific Gravity, Urine 1.020 1.005 - 1.030   pH 5.0 5.0 - 8.0   Glucose, UA NEGATIVE NEGATIVE mg/dL   Hgb urine dipstick NEGATIVE NEGATIVE   Bilirubin Urine NEGATIVE NEGATIVE   Ketones, ur NEGATIVE NEGATIVE mg/dL   Protein, ur NEGATIVE NEGATIVE mg/dL  Nitrite NEGATIVE NEGATIVE   Leukocytes,Ua NEGATIVE NEGATIVE   WBC, UA NONE SEEN 0 - 5 WBC/hpf   Bacteria, UA NONE SEEN NONE SEEN   Squamous Epithelial / LPF 0-5 0 - 5   Mucus PRESENT     Comment: Performed at [facility], [address] [place], [place] [address]  Pregnancy, urine POC     Status: None   Collection Time: 09/04/18 10:44 PM  Result Value Ref Range   Preg Test, Ur NEGATIVE NEGATIVE    Comment:        THE SENSITIVITY OF THIS METHODOLOGY IS >24 mIU/mL     Current Facility-Administered Medications  Medication Dose Route Frequency Provider Last Rate Last Dose  . 0.9 %  sodium chloride infusion   Intravenous Continuous Arnaldo Natal, MD 75 mL/hr at 09/05/18 [phone]    . acetaminophen (TYLENOL) tablet 650 mg  650 mg Oral Q6H PRN Arnaldo Natal, MD       Or  . acetaminophen (TYLENOL) suppository 650 mg  650 mg Rectal Q6H PRN Arnaldo Natal, MD      . Buprenorphine HCl-Naloxone HCl 8-2 MG FILM 2 Film  2 Film Sublingual Daily Adrian Saran, MD   2 Film at 09/05/18 1158  . docusate sodium (COLACE)  capsule 100 mg  100 mg Oral BID Arnaldo Natal, MD      . enoxaparin (LOVENOX) injection 40 mg  40 mg Subcutaneous Q24H Arnaldo Natal, MD      . ondansetron [facility]) tablet 4 mg  4 mg Oral Q6H PRN Arnaldo Natal, MD       Or  . ondansetron [facility]) injection 4 mg  4 mg Intravenous Q6H PRN Arnaldo Natal, MD        Musculoskeletal: Strength & Muscle Tone: within normal limits Gait & Station: normal Patient leans: N/A  Psychiatric Specialty Exam: Physical Exam  Nursing note and vitals reviewed. Constitutional: She is oriented to person, place, and time. She appears well-developed and well-nourished. She appears distressed.  HENT:  Head: Normocephalic and atraumatic.  Eyes: Conjunctivae and EOM are normal.  Neck: Normal range of motion.  Cardiovascular: Normal rate and regular rhythm.  Respiratory: Effort normal. No respiratory distress.  Musculoskeletal: Normal range of motion.  Neurological: She is alert and oriented to person, place, and time.    Review of Systems  Psychiatric/Behavioral: Positive for depression, memory loss, substance abuse and suicidal ideas. Negative for hallucinations. The patient is nervous/anxious and has insomnia.   All other systems reviewed and are negative.   Blood pressure 96/64, pulse 99, temperature 98.1 F (36.7 C), resp. rate 15, height 5\' 1"  (1.549 m), weight 72.1 kg, SpO2 96 %.Body mass index is 30.02 kg/m.  General Appearance: Fairly Groomed  Eye Contact:  Good  Speech:  Clear and Coherent  Volume:  Normal  Mood:  Anxious, Depressed and Irritable  Affect:  Congruent  Thought Process:  Goal Directed  Orientation:  Full (Time, Place, and Person)  Thought Content:  Hallucinations: None  Suicidal Thoughts:  denies  Homicidal Thoughts:  No  Memory:  Immediate;   Fair Recent;   Poor Remote;   Fair  Judgement:  Poor  Insight:  Shallow  Psychomotor Activity:  Restlessness  Concentration:  Concentration: Fair  Recall:  [employer] of Knowledge:  Fair  Language:  Good  Akathisia:  No  Handed:  Right  AIMS (if indicated):     Assets:  Communication Skills Resilience  ADL's:  Intact  Cognition:  WNL  Sleep:   impaired     Treatment Plan Summary: Place patient under involuntary commitment. Daily contact with patient to assess and evaluate symptoms and progress in treatment and Medication management  Restart patient's home medications: Gabapentin 300 mg 3 times daily.  Continue other home medications. Patient is requesting nicotine gum and NicoDerm patch. Defer further medication management to inpatient psychiatry team, patient is reluctant to start antidepressant at this time.  Disposition: Recommend psychiatric Inpatient admission when medically cleared. Supportive therapy provided about ongoing stressors.  Orders placed for inpatient psychiatric admission.  Mariel Craft, MD 09/05/2018 1:42 PM

## 2018-09-05 NOTE — Progress Notes (Signed)
D: Received patient from [facility] Emergency Department. Patient skin assessment completed with Shanda Bumps, BHT, skin is intact, no contraband found with all unit prohibited items locked and stored away for discharge. Pt. prescription medication prior to admissions stored at the pharmacy (ticket in chart). Pt. Was admitted under the services of, Dr. Toni Amend.  Patient during the admissions process is pleasant and cooperative, but tearful and visibly upset with what has transpired that has led to her becoming IVC'd to the unit. Pt. During interviewing reports she took 4 xanax after breaking up with her boyfriend of 5 years to relax and de-stress from all that has been going on and this was too much medication and caused the over-dose of medication. Pt. Denies si/hi/avh, contracts for safety outside and inside the hospital. Pt. 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 Pt. Is discharge focused.   A: Patient oriented to unit/room/call light. Pt. Given extensive admissions education. Patient was encourage to participate in unit activities and continue with plan of care being put into place. Q x 15 minute observation checks were initiated for safety. Pt. Given meal tray to eat, eating good.    R: Patient is not receptive to treatment Plan being put into place, because she feels she can address these stressors she is having outpatient. Safety to be maintained on unit per MD orders.

## 2018-09-05 NOTE — Discharge Summary (Signed)
[employer] [facility] [place] [facility]   PATIENT NAME: Kristine Griffin    MR#:  [id number]  DATE OF BIRTH:  1990-11-30  DATE OF ADMISSION:  09/04/2018 ADMITTING PHYSICIAN: Arnaldo Natal, MD  DATE OF DISCHARGE: 09/05/2018  PRIMARY CARE PHYSICIAN: [name], NP    ADMISSION DIAGNOSIS:  Benzodiazepine abuse (HCC) [F13.10] Benzodiazepine overdose of undetermined intent, initial encounter [T42.4X4A]  DISCHARGE DIAGNOSIS:  Principal Problem:   Overdose of benzodiazepine, accidental or unintentional, initial encounter Active Problems:   Opiate abuse, episodic (HCC)   Depression   Benzodiazepine abuse (HCC)   SECONDARY DIAGNOSIS:   Past Medical History:  Diagnosis Date  . BV (bacterial vaginosis)   . Hip dislocation, right (HCC)    x2  . Migraine without aura     HOSPITAL COURSE:  28 y/o female with PMHX of anxiety/depression presented to ER via EMS after BZ ovedose.  1. Acute Encephalopthy in the setting of BZ overdose:Patient has IVC in place and was seen by Psychiatry will plan to transfer downstairs today.  She is medically cleared for discharge and transfer to behavioral health.  2. Depression/ANxiety: Management as per Psychiatry.  3. Substance abuse on Suboxone   DISCHARGE CONDITIONS AND DIET:  Stable Regular diet  CONSULTS OBTAINED:    DRUG ALLERGIES:  No Known Allergies  DISCHARGE MEDICATIONS:   Allergies as of 09/05/2018   No Known Allergies     Medication List    STOP taking these medications   celecoxib 200 MG capsule Commonly known as:  CeleBREX   gabapentin 300 MG capsule Commonly known as:  NEURONTIN     TAKE these medications   Norethindrone Acetate-Ethinyl Estrad-FE 1-20 MG-MCG(24) tablet Commonly known as:  Microgestin 24 Fe Take 1 tablet by mouth daily.   Suboxone 8-2 MG Film Generic drug:  Buprenorphine HCl-Naloxone HCl Place 1.5 Film under the tongue daily.         Today   CHIEF COMPLAINT:   Patient is alert +depression Denies SI   VITAL SIGNS:  Blood pressure 96/64, pulse 99, temperature 98.1 F (36.7 C), resp. rate 15, height 5\' 1"  (1.549 m), weight 72.1 kg, SpO2 96 %.   REVIEW OF SYSTEMS:  Review of Systems  Constitutional: Negative.  Negative for chills, fever and malaise/fatigue.  HENT: Negative.  Negative for ear discharge, ear pain, hearing loss, nosebleeds and sore throat.   Eyes: Negative.  Negative for blurred vision and pain.  Respiratory: Negative.  Negative for cough, hemoptysis, shortness of breath and wheezing.   Cardiovascular: Negative.  Negative for chest pain, palpitations and leg swelling.  Gastrointestinal: Negative.  Negative for abdominal pain, blood in stool, diarrhea, nausea and vomiting.  Genitourinary: Negative.  Negative for dysuria.  Musculoskeletal: Negative.  Negative for back pain.  Skin: Negative.   Neurological: Negative for dizziness, tremors, speech change, focal weakness, seizures and headaches.  Endo/Heme/Allergies: Negative.  Does not bruise/bleed easily.  Psychiatric/Behavioral: Positive for depression and substance abuse. Negative for hallucinations and suicidal ideas.     PHYSICAL EXAMINATION:  GENERAL:  28 y.o.-year-old patient lying in the bed with no acute distress.  NECK:  Supple, no jugular venous distention. No thyroid enlargement, no tenderness.  LUNGS: Normal breath sounds bilaterally, no wheezing, rales,rhonchi  No use of accessory muscles of respiration.  CARDIOVASCULAR: S1, S2 normal. No murmurs, rubs, or gallops.  ABDOMEN: Soft, non-tender, non-distended. Bowel sounds present. No organomegaly or mass.  EXTREMITIES: No pedal edema, cyanosis, or clubbing.  PSYCHIATRIC: The patient is  alert and oriented x 3.  SKIN: No obvious rash, lesion, or ulcer.   DATA REVIEW:   CBC Recent Labs  Lab 09/04/18 2121  WBC 11.5*  HGB 13.9  HCT 41.8  PLT 234    Chemistries  Recent Labs  Lab 09/04/18 2121  NA 140  K  4.3  CL 106  CO2 24  GLUCOSE 108*  BUN 14  CREATININE 0.58  CALCIUM 9.1  AST 21  ALT 28  ALKPHOS 54  BILITOT 0.6    Cardiac Enzymes No results for input(s): TROPONINI in the last 168 hours.  Microbiology Results  @MICRORSLT48 @  RADIOLOGY:  No results found.    Allergies as of 09/05/2018   No Known Allergies     Medication List    STOP taking these medications   celecoxib 200 MG capsule Commonly known as:  CeleBREX   gabapentin 300 MG capsule Commonly known as:  NEURONTIN     TAKE these medications   Norethindrone Acetate-Ethinyl Estrad-FE 1-20 MG-MCG(24) tablet Commonly known as:  Microgestin 24 Fe Take 1 tablet by mouth daily.   Suboxone 8-2 MG Film Generic drug:  Buprenorphine HCl-Naloxone HCl Place 1.5 Film under the tongue daily.          Management plans discussed with the patient and she is in agreement. Stable for discharge   Patient should follow up with psych  CODE STATUS:     Code Status Orders  (From admission, onward)         Start     Ordered   09/05/18 0430  Full code  Continuous     09/05/18 0429        Code Status History    Date Active Date Inactive Code Status Order ID Comments User Context   02/10/2016 0400 02/11/2016 1454 Full Code [id number]  Arnaldo Natal, MD ED      TOTAL TIME TAKING CARE OF THIS PATIENT: 38 minutes.    Note: This dictation was prepared with Dragon dictation along with smaller phrase technology. Any transcriptional errors that result from this process are unintentional.  Adrian Saran M.D on 09/05/2018 at 5:29 PM  Between 7am to 6pm - Pager - [phone] After 6pm go to [email or web address] - Social research officer, government  [facility]  Office  [phone]  CC: Primary care physician; [name], NP

## 2018-09-05 NOTE — Progress Notes (Signed)
Patient admitted this am with BZ overdose Agree with Admitting MD plan Cont sitter  Psych consult placed via epic when awake

## 2018-09-05 NOTE — ED Notes (Signed)
ED TO INPATIENT HANDOFF REPORT  ED Nurse Name and Phone #: [phone]  S Name/Age/Gender Kristine Griffin 28 y.o. female Room/Bed: ED15A/ED15A  Code Status   Code Status: Prior  Home/SNF/Other Home Patient oriented to: self Is this baseline? No   Triage Complete: Triage complete  Chief Complaint AMS  Triage Note Pt brought in by [place] police dept.  Pt was pulled over for DUI and blew a 0.  Pt states no drug use.  Pt has slurred speech and is sleepy.  Pt states she took suboxone.   Allergies No Known Allergies  Level of Care/Admitting Diagnosis ED Disposition    ED Disposition Condition Comment   Admit  The patient appears reasonably stabilized for admission considering the current resources, flow, and capabilities available in the ED at this time, and I doubt any other [facility] requiring further screening and/or treatment in the ED prior to admission is  present.       B Medical/Surgery History Past Medical History:  Diagnosis Date  . BV (bacterial vaginosis)   . Hip dislocation, right (HCC)    x2  . Migraine without aura    No past surgical history on file.   A IV Location/Drains/Wounds Patient Lines/Drains/Airways Status   Active Line/Drains/Airways    Name:   Placement date:   Placement time:   Site:   Days:   Peripheral IV 03/02/18 Right Wrist   03/02/18    1924    Wrist   187   Peripheral IV 09/04/18 Right Forearm   09/04/18    2122    Forearm   1          Intake/Output Last 24 hours No intake or output data in the 24 hours ending 09/05/18 0251  Labs/Imaging Results for orders placed or performed during the hospital encounter of 09/04/18 (from the past 48 hour(s))  Comprehensive metabolic panel     Status: Abnormal   Collection Time: 09/04/18  9:21 PM  Result Value Ref Range   Sodium 140 135 - 145 mmol/L   Potassium 4.3 3.5 - 5.1 mmol/L   Chloride 106 98 - 111 mmol/L   CO2 24 22 - 32 mmol/L   Glucose, Bld 108 (H) 70 - 99 mg/dL   BUN 14 6 - 20  mg/dL   Creatinine, Ser 9.52 0.44 - 1.00 mg/dL   Calcium 9.1 8.9 - 84.1 mg/dL   Total Protein 7.5 6.5 - 8.1 g/dL   Albumin 4.3 3.5 - 5.0 g/dL   AST 21 15 - 41 U/L   ALT 28 0 - 44 U/L   Alkaline Phosphatase 54 38 - 126 U/L   Total Bilirubin 0.6 0.3 - 1.2 mg/dL   GFR calc non Af Amer >60 >60 mL/min   GFR calc Af Amer >60 >60 mL/min   Anion gap 10 5 - 15    Comment: Performed at [facility], [address] [place], [place] [address]  CBC     Status: Abnormal   Collection Time: 09/04/18  9:21 PM  Result Value Ref Range   WBC 11.5 (H) 4.0 - 10.5 K/uL   RBC 4.70 3.87 - 5.11 MIL/uL   Hemoglobin 13.9 12.0 - 15.0 g/dL   HCT 10.2 72.5 - 36.6 %   MCV 88.9 80.0 - 100.0 fL   MCH 29.6 26.0 - 34.0 pg   MCHC 33.3 30.0 - 36.0 g/dL   RDW 44.0 34.7 - 42.5 %   Platelets 234 150 - 400 K/uL  nRBC 0.0 0.0 - 0.2 %    Comment: Performed at [facility], [address] [place], [place] [address]  Ethanol     Status: None   Collection Time: 09/04/18  9:21 PM  Result Value Ref Range   Alcohol, Ethyl (B) <10 <10 mg/dL    Comment: (NOTE) Lowest detectable limit for serum alcohol is 10 mg/dL. For medical purposes only. Performed at [facility], [address] [place], [place] [address]   Acetaminophen level     Status: Abnormal   Collection Time: 09/04/18  9:21 PM  Result Value Ref Range   Acetaminophen (Tylenol), Serum <10 (L) 10 - 30 ug/mL    Comment: (NOTE) Therapeutic concentrations vary significantly. A range of 10-30 ug/mL  may be an effective concentration for many patients. However, some  are best treated at concentrations outside of this range. Acetaminophen concentrations >150 ug/mL at 4 hours after ingestion  and >50 ug/mL at 12 hours after ingestion are often associated with  toxic reactions. Performed at [facility], [address] [place], [place] [address]   Salicylate level     Status: None   Collection Time: 09/04/18  9:21 PM   Result Value Ref Range   Salicylate Lvl <7.0 2.8 - 30.0 mg/dL    Comment: Performed at [facility], [address] [place], [place] [address]  Urine Drug Screen, Qualitative ([facility] only)     Status: Abnormal   Collection Time: 09/04/18 10:40 PM  Result Value Ref Range   Tricyclic, Ur Screen NONE DETECTED NONE DETECTED   Amphetamines, Ur Screen NONE DETECTED NONE DETECTED   MDMA (Ecstasy)Ur Screen NONE DETECTED NONE DETECTED   Cocaine Metabolite,Ur [place] NONE DETECTED NONE DETECTED   Opiate, Ur Screen NONE DETECTED NONE DETECTED   Phencyclidine (PCP) Ur S NONE DETECTED NONE DETECTED   Cannabinoid 50 Ng, Ur [place] NONE DETECTED NONE DETECTED   Barbiturates, Ur Screen NONE DETECTED NONE DETECTED   Benzodiazepine, Ur Scrn POSITIVE (A) NONE DETECTED   Methadone Scn, Ur NONE DETECTED NONE DETECTED    Comment: (NOTE) Tricyclics + metabolites, urine    Cutoff 1000 ng/mL Amphetamines + metabolites, urine  Cutoff 1000 ng/mL MDMA (Ecstasy), urine              Cutoff 500 ng/mL Cocaine Metabolite, urine          Cutoff 300 ng/mL Opiate + metabolites, urine        Cutoff 300 ng/mL Phencyclidine (PCP), urine         Cutoff 25 ng/mL Cannabinoid, urine                 Cutoff 50 ng/mL Barbiturates + metabolites, urine  Cutoff 200 ng/mL Benzodiazepine, urine              Cutoff 200 ng/mL Methadone, urine                   Cutoff 300 ng/mL The urine drug screen provides only a preliminary, unconfirmed analytical test result and should not be used for non-medical purposes. Clinical consideration and professional judgment should be applied to any positive drug screen result due to possible interfering substances. A more specific alternate chemical method must be used in order to obtain a confirmed analytical result. Gas chromatography / mass spectrometry (GC/MS) is the preferred confirmat ory method. Performed at [facility], [address] [place], [place] [address]   Urinalysis,  Complete w Microscopic  Status: Abnormal   Collection Time: 09/04/18 10:40 PM  Result Value Ref Range   Color, Urine AMBER (A) YELLOW    Comment: BIOCHEMICALS MAY BE AFFECTED BY COLOR   APPearance TURBID (A) CLEAR   Specific Gravity, Urine 1.020 1.005 - 1.030   pH 5.0 5.0 - 8.0   Glucose, UA NEGATIVE NEGATIVE mg/dL   Hgb urine dipstick NEGATIVE NEGATIVE   Bilirubin Urine NEGATIVE NEGATIVE   Ketones, ur NEGATIVE NEGATIVE mg/dL   Protein, ur NEGATIVE NEGATIVE mg/dL   Nitrite NEGATIVE NEGATIVE   Leukocytes,Ua NEGATIVE NEGATIVE   WBC, UA NONE SEEN 0 - 5 WBC/hpf   Bacteria, UA NONE SEEN NONE SEEN   Squamous Epithelial / LPF 0-5 0 - 5   Mucus PRESENT     Comment: Performed at [facility], [address] [place], [place] [address]  Pregnancy, urine POC     Status: None   Collection Time: 09/04/18 10:44 PM  Result Value Ref Range   Preg Test, Ur NEGATIVE NEGATIVE    Comment:        THE SENSITIVITY OF THIS METHODOLOGY IS >24 mIU/mL    No results found.  Pending Labs Unresulted Labs (From admission, onward)   None      Vitals/Pain Today's Vitals   09/04/18 2237 09/04/18 2300 09/05/18 0026 09/05/18 0100  BP:  100/67 91/63 108/86  Pulse: 98 79  90  Resp: 16 20 19  (!) 29  Temp:      TempSrc:      SpO2: 98% 95%  99%  Weight:      Height:        Isolation Precautions No active isolations  Medications Medications  sodium chloride 0.9 % bolus 1,000 mL (0 mLs Intravenous Stopped 09/04/18 2331)  ondansetron (ZOFRAN) injection 4 mg (4 mg Intravenous Given 09/04/18 2242)  naloxone [facility]) injection 0.4 mg (0.4 mg Intravenous Given 09/04/18 2241)  naloxone [facility]) injection 1 mg (1 mg Intravenous Given 09/05/18 0110)  sodium chloride 0.9 % bolus 1,000 mL (1,000 mLs Intravenous New Bag/Given 09/05/18 0110)    Mobility walks with person assist Moderate fall risk   Focused Assessments Neuro Assessment Handoff:  Cardiac Rhythm: Normal sinus rhythm       Neuro  Assessment: Exceptions to WDL Neuro Checks:      Last Documented NIHSS Modified Score:   Has TPA been given? No If patient is a Neuro Trauma and patient is going to OR before floor call report to 4N Charge nurse: [phone] or [phone]     R Recommendations: See Admitting Provider Note  Report given to:   Additional Notes:

## 2018-09-05 NOTE — Tx Team (Signed)
Initial Treatment Plan 09/05/2018 11:05 PM Requel Muellner Murcia CWU:[id number]    PATIENT STRESSORS: Marital or family conflict Substance abuse   PATIENT STRENGTHS: Ability for insight Active sense of humor Average or above average intelligence Capable of independent living Communication skills Financial means General fund of knowledge Motivation for treatment/growth Physical Health Religious Affiliation Special hobby/interest Supportive family/friends Work skills   PATIENT IDENTIFIED PROBLEMS: Overdose/substance abuse 09/05/2018  Relationship conflict/stressors 09/05/2018  Stress 09/05/2018                 DISCHARGE CRITERIA:  Improved stabilization in mood, thinking, and/or behavior Motivation to continue treatment in a less acute level of care Need for constant or close observation no longer present Withdrawal symptoms are absent or subacute and managed without 24-hour nursing intervention  PRELIMINARY DISCHARGE PLAN: Attend 12-step recovery group Outpatient therapy Participate in family therapy Return to previous living arrangement Return to previous work or school arrangements  PATIENT/FAMILY INVOLVEMENT: This treatment plan has been presented to and reviewed with the patient, Sheryal Petrovic Mollenhauer. The patient has been given the opportunity to ask questions and make suggestions.  Lenox Ponds, RN 09/05/2018, 11:05 PM

## 2018-09-05 NOTE — ED Notes (Signed)
Narcan given with no effect.

## 2018-09-05 NOTE — Progress Notes (Signed)
Pt sleeping on and off most of the day thus far. When pt is awake she mostly asks about her car, food, and more Suboxone. Pt has stated that she "wants to die" and "my son is the only reason I'm still alive." Pt is 1:1 with sitter. Psych to come back and finish evaluating pt. Will continue to monitor.

## 2018-09-05 NOTE — ED Provider Notes (Signed)
I assume care the patient from Dr. Sharma Covert at 11:00 PM.  Patient markedly somnolent systolic blood pressure of 80.  As such patient was given an additional 1 mg of Narcan without any response.  On evaluation patient's pupils are dilated.  Urinalysis consistent with benzodiazepine.  As such I suspect the patient's overdose to be secondary to benzodiazepine.  Patient given an additional liter of IV normal saline.  Patient is now conversant however still somnolent.  Patient discussed with Dr. Sheryle Hail for hospital admission for further evaluation and management.   Darci Current, MD 09/05/18 [phone]

## 2018-09-05 NOTE — Progress Notes (Signed)
Report given to [facility].  Pt more alert and irritable. Pt refused blood thinner and stool softener.  Called for armed security escort. Henriette Combs RN

## 2018-09-05 NOTE — BH Assessment (Signed)
Patient has been accepted to [facility].  Accepting physician is Dr. Viviano Simas.  Attending Physician will be Dr. Toni Amend.  Patient has been assigned to room 323, by [facility] Charge Nurse Fults.  Call report to [phone].  Representative/Transfer Coordinator is Psychologist, prison and probation services (TTS). Patient pre-admitted by [facility] Patient Access Dondra Prader)

## 2018-09-05 NOTE — Plan of Care (Signed)
Patient just recently admitted to the unit. Patient has not had sufficient time to show progressions at this time. Will continue to monitor for progressions.   Problem: Education: Goal: Knowledge of [place] [facility] Education information/materials will improve Outcome: Not Progressing Goal: Mental status will improve Outcome: Not Progressing   Problem: Health Behavior/Discharge Planning: Goal: Compliance with treatment plan for underlying cause of condition will improve Outcome: Not Progressing   Problem: Safety: Goal: Periods of time without injury will increase Outcome: Not Progressing   Problem: Education: Goal: Understanding of discharge needs will improve Outcome: Not Progressing   Problem: Coping: Goal: Coping ability will improve Outcome: Not Progressing

## 2018-09-05 NOTE — H&P (Signed)
Kristine Griffin is an 28 y.o. female.   Chief Complaint: Altered mental status HPI: The patient with past medical history of benzodiazepine abuse, anxiety and depression presents to the emergency department after being pulled over by the police for driving erratically.  Her speech was slurred and the patient appeared very somnolent.  By the time she arrived to jail she was nearly unresponsive.  She was given Narcan without improvement.  Psychology screen revealed positive benzodiazepine metabolites.  The patient's oxygen saturations were normal on room air and she maintained her airway throughout emergency department evaluation.  Due to continued somnolence and minimal responsiveness the emergency department staff called the hospitalist service for admission.  Past Medical History:  Diagnosis Date  . BV (bacterial vaginosis)   . Hip dislocation, right (HCC)    x2  . Migraine without aura     No past surgical history on file. Patient is inebriated and cannot contribute to her history  Family History  Problem Relation Age of Onset  . Diabetes Mellitus II Maternal Grandmother   . Diabetes Mellitus II Maternal Grandfather    Social History:  reports that she has quit smoking. She has never used smokeless tobacco. She reports current drug use. She reports that she does not drink alcohol.  Allergies: No Known Allergies  Prior to Admission medications   Medication Sig Start Date End Date Taking? Authorizing Provider  celecoxib (CELEBREX) 200 MG capsule Take 1 capsule (200 mg total) by mouth daily. 07/03/18  Yes Hyatt, Max T, DPM  gabapentin (NEURONTIN) 300 MG capsule Take 300 mg by mouth 3 (three) times daily. 02/05/18  Yes [provider]  Norethindrone Acetate-Ethinyl Estrad-FE (MICROGESTIN 24 FE) 1-20 MG-MCG(24) tablet Take 1 tablet by mouth daily. 08/18/18  Yes Copland, Alicia B, PA-C  SUBOXONE 8-2 MG FILM Place 1.5 Film under the tongue daily.   Yes [provider]      Results for orders placed or performed during the hospital encounter of 09/04/18 (from the past 48 hour(s))  Comprehensive metabolic panel     Status: Abnormal   Collection Time: 09/04/18  9:21 PM  Result Value Ref Range   Sodium 140 135 - 145 mmol/L   Potassium 4.3 3.5 - 5.1 mmol/L   Chloride 106 98 - 111 mmol/L   CO2 24 22 - 32 mmol/L   Glucose, Bld 108 (H) 70 - 99 mg/dL   BUN 14 6 - 20 mg/dL   Creatinine, Ser 6.81 0.44 - 1.00 mg/dL   Calcium 9.1 8.9 - 15.7 mg/dL   Total Protein 7.5 6.5 - 8.1 g/dL   Albumin 4.3 3.5 - 5.0 g/dL   AST 21 15 - 41 U/L   ALT 28 0 - 44 U/L   Alkaline Phosphatase 54 38 - 126 U/L   Total Bilirubin 0.6 0.3 - 1.2 mg/dL   GFR calc non Af Amer >60 >60 mL/min   GFR calc Af Amer >60 >60 mL/min   Anion gap 10 5 - 15    Comment: Performed at [facility], [address] [place], [place] [address]  CBC     Status: Abnormal   Collection Time: 09/04/18  9:21 PM  Result Value Ref Range   WBC 11.5 (H) 4.0 - 10.5 K/uL   RBC 4.70 3.87 - 5.11 MIL/uL   Hemoglobin 13.9 12.0 - 15.0 g/dL   HCT 55.9 74.1 - 63.8 %   MCV 88.9 80.0 - 100.0 fL   MCH 29.6 26.0 - 34.0 pg  MCHC 33.3 30.0 - 36.0 g/dL   RDW 85.6 31.4 - 97.0 %   Platelets 234 150 - 400 K/uL   nRBC 0.0 0.0 - 0.2 %    Comment: Performed at [id number], [address] [place], [place] [address]  Ethanol     Status: None   Collection Time: 09/04/18  9:21 PM  Result Value Ref Range   Alcohol, Ethyl (B) <10 <10 mg/dL    Comment: (NOTE) Lowest detectable limit for serum alcohol is 10 mg/dL. For medical purposes only. Performed at [facility], [address] [place], [place] [address]   Acetaminophen level     Status: Abnormal   Collection Time: 09/04/18  9:21 PM  Result Value Ref Range   Acetaminophen (Tylenol), Serum <10 (L) 10 - 30 ug/mL    Comment: (NOTE) Therapeutic concentrations vary significantly. A range of 10-30 ug/mL  may be an effective concentration for  many patients. However, some  are best treated at concentrations outside of this range. Acetaminophen concentrations >150 ug/mL at 4 hours after ingestion  and >50 ug/mL at 12 hours after ingestion are often associated with  toxic reactions. Performed at [facility], [address] [place], [place] [address]   Salicylate level     Status: None   Collection Time: 09/04/18  9:21 PM  Result Value Ref Range   Salicylate Lvl <7.0 2.8 - 30.0 mg/dL    Comment: Performed at [facility], [address] [place], [place] [address]  Urine Drug Screen, Qualitative ([facility] only)     Status: Abnormal   Collection Time: 09/04/18 10:40 PM  Result Value Ref Range   Tricyclic, Ur Screen NONE DETECTED NONE DETECTED   Amphetamines, Ur Screen NONE DETECTED NONE DETECTED   MDMA (Ecstasy)Ur Screen NONE DETECTED NONE DETECTED   Cocaine Metabolite,Ur [place] NONE DETECTED NONE DETECTED   Opiate, Ur Screen NONE DETECTED NONE DETECTED   Phencyclidine (PCP) Ur S NONE DETECTED NONE DETECTED   Cannabinoid 50 Ng, Ur [place] NONE DETECTED NONE DETECTED   Barbiturates, Ur Screen NONE DETECTED NONE DETECTED   Benzodiazepine, Ur Scrn POSITIVE (A) NONE DETECTED   Methadone Scn, Ur NONE DETECTED NONE DETECTED    Comment: (NOTE) Tricyclics + metabolites, urine    Cutoff 1000 ng/mL Amphetamines + metabolites, urine  Cutoff 1000 ng/mL MDMA (Ecstasy), urine              Cutoff 500 ng/mL Cocaine Metabolite, urine          Cutoff 300 ng/mL Opiate + metabolites, urine        Cutoff 300 ng/mL Phencyclidine (PCP), urine         Cutoff 25 ng/mL Cannabinoid, urine                 Cutoff 50 ng/mL Barbiturates + metabolites, urine  Cutoff 200 ng/mL Benzodiazepine, urine              Cutoff 200 ng/mL Methadone, urine                   Cutoff 300 ng/mL The urine drug screen provides only a preliminary, unconfirmed analytical test result and should not be used for non-medical purposes. Clinical consideration and  professional judgment should be applied to any positive drug screen result due to possible interfering substances. A more specific alternate chemical method must be used in order to obtain a confirmed analytical result. Gas chromatography / mass spectrometry (GC/MS) is the preferred [employer]  ory method. Performed at [facility], [address] [place], [place] [address]   Urinalysis, Complete w Microscopic     Status: Abnormal   Collection Time: 09/04/18 10:40 PM  Result Value Ref Range   Color, Urine AMBER (A) YELLOW    Comment: BIOCHEMICALS MAY BE AFFECTED BY COLOR   APPearance TURBID (A) CLEAR   Specific Gravity, Urine 1.020 1.005 - 1.030   pH 5.0 5.0 - 8.0   Glucose, UA NEGATIVE NEGATIVE mg/dL   Hgb urine dipstick NEGATIVE NEGATIVE   Bilirubin Urine NEGATIVE NEGATIVE   Ketones, ur NEGATIVE NEGATIVE mg/dL   Protein, ur NEGATIVE NEGATIVE mg/dL   Nitrite NEGATIVE NEGATIVE   Leukocytes,Ua NEGATIVE NEGATIVE   WBC, UA NONE SEEN 0 - 5 WBC/hpf   Bacteria, UA NONE SEEN NONE SEEN   Squamous Epithelial / LPF 0-5 0 - 5   Mucus PRESENT     Comment: Performed at [facility], [address] [place], [place] [address]  Pregnancy, urine POC     Status: None   Collection Time: 09/04/18 10:44 PM  Result Value Ref Range   Preg Test, Ur NEGATIVE NEGATIVE    Comment:        THE SENSITIVITY OF THIS METHODOLOGY IS >24 mIU/mL    No results found.  Review of Systems  Unable to perform ROS: Mental status change    Blood pressure 108/86, pulse 90, temperature 97.9 F (36.6 C), temperature source Oral, resp. rate (!) 29, height 5\' 1"  (1.549 m), weight 72.6 kg, SpO2 99 %. Physical Exam  Vitals reviewed. Constitutional: She is oriented to person, place, and time. She appears well-developed and well-nourished. No distress.  HENT:  Head: Normocephalic and atraumatic.  Mouth/Throat: Oropharynx is clear and moist.  Eyes: Pupils are equal, round, and reactive to light.  Conjunctivae and EOM are normal. No scleral icterus.  Neck: Normal range of motion. Neck supple. No JVD present. No tracheal deviation present. No thyromegaly present.  Cardiovascular: Normal rate, regular rhythm and normal heart sounds. Exam reveals no gallop and no friction rub.  No murmur heard. Respiratory: Effort normal and breath sounds normal.  GI: Soft. Bowel sounds are normal. She exhibits no distension. There is no abdominal tenderness.  Genitourinary:    Genitourinary Comments: Deferred   Musculoskeletal: Normal range of motion.        General: No edema.  Lymphadenopathy:    She has no cervical adenopathy.  Neurological: She is alert and oriented to person, place, and time. No cranial nerve deficit. She exhibits normal muscle tone.  Skin: Skin is warm and dry. No rash noted. No erythema.  Psychiatric:  Patient is barely responsive to voice thus difficult to assess mental status.     Assessment/Plan This is a 28 year old female admitted for benzodiazepine abuse 1.  Benzodiazepine abuse: Await metabolism of benzodiazepines and monitor for signs or symptoms of benzodiazepine withdrawal.  The patient has not had any seizures with this encounter. 2.  Altered mental status: Somnolent but secondary to above.  Continue to monitor oxygen saturations 3.  Obesity: BMI is 30; encourage healthy diet and exercise 4.  DVT prophylaxis: Lovenox 5.  GI prophylaxis: None The patient is a full code.  Time spent on admission orders and patient care approximately 45 minutes  Arnaldo Nataliamond,  Nyelah Emmerich S, MD 09/05/2018, 3:37 AM

## 2018-09-06 DIAGNOSIS — F1110 Opioid abuse, uncomplicated: Secondary | ICD-10-CM

## 2018-09-06 DIAGNOSIS — F332 Major depressive disorder, recurrent severe without psychotic features: Secondary | ICD-10-CM

## 2018-09-06 DIAGNOSIS — T424X1A Poisoning by benzodiazepines, accidental (unintentional), initial encounter: Principal | ICD-10-CM

## 2018-09-06 DIAGNOSIS — F1310 Sedative, hypnotic or anxiolytic abuse, uncomplicated: Secondary | ICD-10-CM

## 2018-09-06 MED ORDER — GABAPENTIN 300 MG PO CAPS: 600.0000 mg | ORAL_CAPSULE | Freq: Three times a day (TID) | ORAL | 0 refills | Status: DC

## 2018-09-06 MED ORDER — GABAPENTIN 300 MG PO CAPS: 600.0000 mg | ORAL_CAPSULE | Freq: Three times a day (TID) | ORAL | Status: DC

## 2018-09-06 NOTE — Progress Notes (Signed)
  [facility] Adult Case Management Discharge Plan :  Will you be returning to the same living situation after discharge:  Yes,  grandparents home At discharge, do you have transportation home?: Yes,  aunt Do you have the ability to pay for your medications: Yes,  medicaid  Release of information consent forms completed and in the chart;   Patient to Follow up at: Follow-up Information    [facility] Follow up.   Why:  Walk in hours are Monday-Friday from 9:00AM-4:00PM. Please call when you arrive as they are limiting number of people insude at a time. Please bring insurance information and current medication list with you. Thank You! Contact information: [address] [place] [address] [phone]        [facility] Follow up.   Why:  Please call to find out if you have been discharged from their services. If so, please follow up with [facility]. Thank you! Contact information: [address] [place] [address] [phone]           Next level of care provider has access to [facility] Link:no  Safety Planning and Suicide Prevention discussed: Yes,  SPE completed with pt as pt declined collateral contact  Have you used any form of tobacco in the last 30 days? (Cigarettes, Smokeless Tobacco, Cigars, and/or Pipes): Yes  Has patient been referred to the Quitline?: Patient refused referral  Patient has been referred for addiction treatment: N/A  Mechele Dawley, LCSW 09/06/2018, 1:19 PM

## 2018-09-06 NOTE — H&P (Signed)
Psychiatric Admission Assessment Adult  Patient Identification: Kristine Griffin MRN:  [id number] Date of Evaluation:  09/06/2018 Chief Complaint:  Major depression order Principal Diagnosis: Overdose of benzodiazepine, accidental or unintentional, initial encounter Diagnosis:  Principal Problem:   Overdose of benzodiazepine, accidental or unintentional, initial encounter Active Problems:   Hip pain  History of Present Illness: Patient seen chart reviewed.  Patient known from previous encounters.  Patient came to the hospital after being pulled over by law enforcement driving erratically.  She was taken to jail where she lost consciousness.  Patient appeared to have taken an excessive amount of benzodiazepines and required brief treatment on the medical service.  She has now been transferred to psychiatry.  On interview today the patient says that she has been under some extra stress recently.  She recently broke up with her boyfriend.  She has chronic worries about taking care of her son and trying to make enough money as well as dealing with her chronic pain.  The night in question she says that she made the mistake of hanging out with her aunt who has an active substance abuse problem.  Patient admits to having taken some Xanax.  She states that she very much regrets it now and feels very ashamed of what she did.  Denies that she was drinking or using any other drugs at the time.  Denies using any narcotics specifically.  Patient denies that there was any suicidal intent whatsoever.  Denies any wish to die.  Currently denies feeling sad depressed or hopeless.  Does have some chronic nervousness but it is about the same as usual.  No psychosis.  No new medical problems. Associated Signs/Symptoms: Depression Symptoms:  anxiety, (Hypo) Manic Symptoms:  None Anxiety Symptoms:  Excessive Worry, Psychotic Symptoms:  None PTSD Symptoms: Negative Total Time spent with patient: 1 hour  Past Psychiatric  History: Patient has a past history of substance abuse going back several years.  She has been seen several times before at our hospital for remarkably similar circumstances with the last evaluation being about 2-1/2 years ago.  She denies that she is ever seriously tried to kill her self but she has had several of these potentially dangerous overdoses.  Patient says that she has not had any other overdoses or close calls in the past 2-1/2 years but instead has been going to [facility] staying involved in the Suboxone program and staying clean.  She has chosen not to try medication for anxiety or depression.  Follows up with outpatient care and apparently has been trying to stay very involved with it at [facility].  No known psychosis no known bipolar disorder.  Currently involved in Suboxone treatment.  Is the patient at risk to self? No.  Has the patient been a risk to self in the past 6 months? No.  Has the patient been a risk to self within the distant past? No.  Is the patient a risk to others? No.  Has the patient been a risk to others in the past 6 months? No.  Has the patient been a risk to others within the distant past? No.   Prior Inpatient Therapy:   Prior Outpatient Therapy:    Alcohol Screening: 1. How often do you have a drink containing alcohol?: Never 2. How many drinks containing alcohol do you have on a typical day when you are drinking?: 1 or 2 3. How often do you have six or more drinks on one occasion?: Never AUDIT-C Score: 0  4. How often during the last year have you found that you were not able to stop drinking once you had started?: Never 5. How often during the last year have you failed to do what was normally expected from you becasue of drinking?: Never 6. How often during the last year have you needed a first drink in the morning to get yourself going after a heavy drinking session?: Never 7. How often during the last year have you had a feeling of guilt of remorse after drinking?:  Never 8. How often during the last year have you been unable to remember what happened the night before because you had been drinking?: Never 9. Have you or someone else been injured as a result of your drinking?: No 10. Has a relative or friend or a doctor or another health worker been concerned about your drinking or suggested you cut down?: No Alcohol Use Disorder Identification Test Final Score (AUDIT): 0 Alcohol Brief Interventions/Follow-up: AUDIT Score <7 follow-up not indicated Substance Abuse History in the last 12 months:  Yes.   Consequences of Substance Abuse: Medical Consequences:  Obviously this hospital stay was the result of acute substance abuse and really all of her previous psychiatric encounters have been related to substance abuse. Previous Psychotropic Medications: Yes  Psychological Evaluations: Yes  Past Medical History:  Past Medical History:  Diagnosis Date  . BV (bacterial vaginosis)   . Hip dislocation, right (HCC)    x2  . Migraine without aura    History reviewed. No pertinent surgical history. Family History:  Family History  Problem Relation Age of Onset  . Diabetes Mellitus II Maternal Grandmother   . Diabetes Mellitus II Maternal Grandfather    Family Psychiatric  History: Patient apparently has a pretty significant history of multiple people in her family with substance abuse problems including her mother and her aunt. Tobacco Screening: Have you used any form of tobacco in the last 30 days? (Cigarettes, Smokeless Tobacco, Cigars, and/or Pipes): Yes Tobacco use, Select all that apply: 5 or more cigarettes per day Are you interested in Tobacco Cessation Medications?: Yes, will notify MD for an order Counseled patient on smoking cessation including recognizing danger situations, developing coping skills and basic information about quitting provided: Yes Social History:  Social History   Substance and Sexual Activity  Alcohol Use No  . Frequency:  Never     Social History   Substance and Sexual Activity  Drug Use Yes   Comment: H/o drug treatment    Additional Social History: Marital status: Single Does patient have children?: Yes How many children?: 1 How is patient's relationship with their children?: 63 yo son. Pt reports the "Our bond is amazing"                         Allergies:  No Known Allergies Lab Results:  Results for orders placed or performed during the hospital encounter of 09/04/18 (from the past 48 hour(s))  Comprehensive metabolic panel     Status: Abnormal   Collection Time: 09/04/18  9:21 PM  Result Value Ref Range   Sodium 140 135 - 145 mmol/L   Potassium 4.3 3.5 - 5.1 mmol/L   Chloride 106 98 - 111 mmol/L   CO2 24 22 - 32 mmol/L   Glucose, Bld 108 (H) 70 - 99 mg/dL   BUN 14 6 - 20 mg/dL   Creatinine, Ser 1.61 0.44 - 1.00 mg/dL   Calcium 9.1 8.9 -  10.3 mg/dL   Total Protein 7.5 6.5 - 8.1 g/dL   Albumin 4.3 3.5 - 5.0 g/dL   AST 21 15 - 41 U/L   ALT 28 0 - 44 U/L   Alkaline Phosphatase 54 38 - 126 U/L   Total Bilirubin 0.6 0.3 - 1.2 mg/dL   GFR calc non Af Amer >60 >60 mL/min   GFR calc Af Amer >60 >60 mL/min   Anion gap 10 5 - 15    Comment: Performed at [facility], [address] [place], [place] [address]  CBC     Status: Abnormal   Collection Time: 09/04/18  9:21 PM  Result Value Ref Range   WBC 11.5 (H) 4.0 - 10.5 K/uL   RBC 4.70 3.87 - 5.11 MIL/uL   Hemoglobin 13.9 12.0 - 15.0 g/dL   HCT 19.1 47.8 - 29.5 %   MCV 88.9 80.0 - 100.0 fL   MCH 29.6 26.0 - 34.0 pg   MCHC 33.3 30.0 - 36.0 g/dL   RDW 62.1 30.8 - 65.7 %   Platelets 234 150 - 400 K/uL   nRBC 0.0 0.0 - 0.2 %    Comment: Performed at [facility], [address] [place], [place] [address]  Ethanol     Status: None   Collection Time: 09/04/18  9:21 PM  Result Value Ref Range   Alcohol, Ethyl (B) <10 <10 mg/dL    Comment: (NOTE) Lowest detectable limit for serum alcohol is 10 mg/dL. For  medical purposes only. Performed at [facility], [address] [place], [place] [address]   Acetaminophen level     Status: Abnormal   Collection Time: 09/04/18  9:21 PM  Result Value Ref Range   Acetaminophen (Tylenol), Serum <10 (L) 10 - 30 ug/mL    Comment: (NOTE) Therapeutic concentrations vary significantly. A range of 10-30 ug/mL  may be an effective concentration for many patients. However, some  are best treated at concentrations outside of this range. Acetaminophen concentrations >150 ug/mL at 4 hours after ingestion  and >50 ug/mL at 12 hours after ingestion are often associated with  toxic reactions. Performed at [facility], [address] [place], [place] [address]   Salicylate level     Status: None   Collection Time: 09/04/18  9:21 PM  Result Value Ref Range   Salicylate Lvl <7.0 2.8 - 30.0 mg/dL    Comment: Performed at [facility], [address] [place], [place] [address]  TSH     Status: None   Collection Time: 09/04/18  9:21 PM  Result Value Ref Range   TSH 3.309 0.350 - 4.500 uIU/mL    Comment: Performed by a 3rd Generation assay with a functional sensitivity of <=0.01 uIU/mL. Performed at [facility], [address] [place], [place] [address]   Urine Drug Screen, Qualitative [facility] only)     Status: Abnormal   Collection Time: 09/04/18 10:40 PM  Result Value Ref Range   Tricyclic, Ur Screen NONE DETECTED NONE DETECTED   Amphetamines, Ur Screen NONE DETECTED NONE DETECTED   MDMA (Ecstasy)Ur Screen NONE DETECTED NONE DETECTED   Cocaine Metabolite,Ur [place] NONE DETECTED NONE DETECTED   Opiate, Ur Screen NONE DETECTED NONE DETECTED   Phencyclidine (PCP) Ur S NONE DETECTED NONE DETECTED   Cannabinoid 50 Ng, Ur [place] NONE DETECTED NONE DETECTED   Barbiturates, Ur Screen NONE DETECTED NONE DETECTED   Benzodiazepine, Ur Scrn POSITIVE (A) NONE DETECTED   Methadone Scn,  Ur NONE DETECTED NONE DETECTED    Comment:  (NOTE) Tricyclics + metabolites, urine    Cutoff 1000 ng/mL Amphetamines + metabolites, urine  Cutoff 1000 ng/mL MDMA (Ecstasy), urine              Cutoff 500 ng/mL Cocaine Metabolite, urine          Cutoff 300 ng/mL Opiate + metabolites, urine        Cutoff 300 ng/mL Phencyclidine (PCP), urine         Cutoff 25 ng/mL Cannabinoid, urine                 Cutoff 50 ng/mL Barbiturates + metabolites, urine  Cutoff 200 ng/mL Benzodiazepine, urine              Cutoff 200 ng/mL Methadone, urine                   Cutoff 300 ng/mL The urine drug screen provides only a preliminary, unconfirmed analytical test result and should not be used for non-medical purposes. Clinical consideration and professional judgment should be applied to any positive drug screen result due to possible interfering substances. A more specific alternate chemical method must be used in order to obtain a confirmed analytical result. Gas chromatography / mass spectrometry (GC/MS) is the preferred confirmat ory method. Performed at [facility], [address] [place], [place] [address]   Urinalysis, Complete w Microscopic     Status: Abnormal   Collection Time: 09/04/18 10:40 PM  Result Value Ref Range   Color, Urine AMBER (A) YELLOW    Comment: BIOCHEMICALS MAY BE AFFECTED BY COLOR   APPearance TURBID (A) CLEAR   Specific Gravity, Urine 1.020 1.005 - 1.030   pH 5.0 5.0 - 8.0   Glucose, UA NEGATIVE NEGATIVE mg/dL   Hgb urine dipstick NEGATIVE NEGATIVE   Bilirubin Urine NEGATIVE NEGATIVE   Ketones, ur NEGATIVE NEGATIVE mg/dL   Protein, ur NEGATIVE NEGATIVE mg/dL   Nitrite NEGATIVE NEGATIVE   Leukocytes,Ua NEGATIVE NEGATIVE   WBC, UA NONE SEEN 0 - 5 WBC/hpf   Bacteria, UA NONE SEEN NONE SEEN   Squamous Epithelial / LPF 0-5 0 - 5   Mucus PRESENT     Comment: Performed at [facility], [address] [place], [place] [address]  Pregnancy, urine POC     Status: None   Collection Time: 09/04/18  10:44 PM  Result Value Ref Range   Preg Test, Ur NEGATIVE NEGATIVE    Comment:        THE SENSITIVITY OF THIS METHODOLOGY IS >24 mIU/mL     Blood Alcohol level:  Lab Results  Component Value Date   ETH <10 09/04/2018   ETH <10 01/04/2018    Metabolic Disorder Labs:  Lab Results  Component Value Date   HGBA1C 5.4 02/10/2016   No results found for: PROLACTIN No results found for: CHOL, TRIG, HDL, CHOLHDL, VLDL, LDLCALC  Current Medications: Current Facility-Administered Medications  Medication Dose Route Frequency Provider Last Rate Last Dose  . acetaminophen (TYLENOL) tablet 650 mg  650 mg Oral Q6H PRN Mariel Craft, MD       Or  . acetaminophen (TYLENOL) suppository 650 mg  650 mg Rectal Q6H PRN Mariel Craft, MD      . alum & mag hydroxide-simeth (MAALOX/MYLANTA) 200-200-20 MG/5ML suspension 30 mL  30 mL Oral Q4H PRN Mariel Craft, MD      . Buprenorphine HCl-Naloxone HCl 8-2 MG FILM  2 Film  2 Film Sublingual Daily Mariel Craft, MD      . celecoxib (CELEBREX) capsule 200 mg  200 mg Oral Daily Mariel Craft, MD   200 mg at 09/06/18 8616  . docusate sodium (COLACE) capsule 100 mg  100 mg Oral BID Mariel Craft, MD      . gabapentin (NEURONTIN) capsule 600 mg  600 mg Oral TID Griffith Santilli T, MD      . magnesium hydroxide (MILK OF MAGNESIA) suspension 30 mL  30 mL Oral Daily PRN Mariel Craft, MD      . nicotine (NICODERM CQ - dosed in mg/24 hours) patch 14 mg  14 mg Transdermal Daily Mariel Craft, MD      . nicotine polacrilex (NICORETTE) gum 2 mg  2 mg Oral PRN Mariel Craft, MD      . Norethindrone Acetate-Ethinyl Estrad-FE (LOESTRIN 24 FE) 1-20 MG-MCG(24) tablet 1 tablet  1 tablet Oral Daily Mariel Craft, MD      . ondansetron [facility]) tablet 4 mg  4 mg Oral Q6H PRN Mariel Craft, MD       Or  . ondansetron [facility]) injection 4 mg  4 mg Intravenous Q6H PRN Mariel Craft, MD       PTA Medications: Medications Prior to Admission   Medication Sig Dispense Refill Last Dose  . Norethindrone Acetate-Ethinyl Estrad-FE (MICROGESTIN 24 FE) 1-20 MG-MCG(24) tablet Take 1 tablet by mouth daily. 84 tablet 1 Unknown at Unknown  . SUBOXONE 8-2 MG FILM Place 1.5 Film under the tongue daily.  0 Unknown at Unknown    Musculoskeletal: Strength & Muscle Tone: within normal limits Gait & Station: normal Patient leans: N/A  Psychiatric Specialty Exam: Physical Exam  Nursing note and vitals reviewed. Constitutional: She appears well-developed and well-nourished.  HENT:  Head: Normocephalic and atraumatic.  Eyes: Pupils are equal, round, and reactive to light. Conjunctivae are normal.  Neck: Normal range of motion.  Cardiovascular: Regular rhythm and normal heart sounds.  Respiratory: Effort normal.  GI: Soft.  Musculoskeletal: Normal range of motion.  Neurological: She is alert.  Skin: Skin is warm and dry.  Psychiatric: She has a normal mood and affect. Her behavior is normal. Judgment and thought content normal.    Review of Systems  Constitutional: Negative.   HENT: Negative.   Eyes: Negative.   Respiratory: Negative.   Cardiovascular: Negative.   Gastrointestinal: Negative.   Musculoskeletal: Negative.   Skin: Negative.   Neurological: Negative.   Psychiatric/Behavioral: Positive for substance abuse. Negative for hallucinations, memory loss and suicidal ideas. The patient is not nervous/anxious and does not have insomnia.     Blood pressure (!) 130/94, pulse 84, temperature 97.6 F (36.4 C), temperature source Oral, resp. rate 17, height 5\' 1"  (1.549 m), weight 73 kg, SpO2 100 %.Body mass index is 30.42 kg/m.  General Appearance: Fairly Groomed  Eye Contact:  Good  Speech:  Clear and Coherent  Volume:  Normal  Mood:  Euthymic  Affect:  Appropriate and Full Range  Thought Process:  Goal Directed  Orientation:  Full (Time, Place, and Person)  Thought Content:  Logical  Suicidal Thoughts:  No  Homicidal  Thoughts:  No  Memory:  Immediate;   Fair Recent;   Fair Remote;   Fair  Judgement:  Fair  Insight:  Fair  Psychomotor Activity:  Normal  Concentration:  Concentration: Fair  Recall:  [employer] of Knowledge:  Fair  Language:  Fair  Akathisia:  No  Handed:  Right  AIMS (if indicated):     Assets:  Communication Skills Desire for Improvement Financial Resources/Insurance Housing Physical Health Resilience Social Support  ADL's:  Intact  Cognition:  WNL  Sleep:  Number of Hours: 5    Treatment Plan Summary: Plan Patient has been assessed by myself and social work and nursing.  Appears to be at her baseline mental state.  Not endorsing symptoms of major depression.  No evidence of mania.  Consistently denies suicidal ideation.  Patient does not appear at this point to meet commitment criteria.  She is extremely agreeable to continuing with outpatient treatment in the community in fact 1 of her biggest worries right now is that she might be dismissed from the program at [facility].  In any case does not meet commitment criteria and can be discharged at her request.  She will stay with her grandmother tonight and has follow-up arrangements at any time with peers support and with [facility].  Continue attending 12-step groups which she is still doing online.  No prescriptions given no new medication.  Observation Level/Precautions:  15 minute checks  Laboratory:  Chemistry Profile  Psychotherapy:    Medications:    Consultations:    Discharge Concerns:    Estimated LOS:  Other:     Physician Treatment Plan for Primary Diagnosis: Overdose of benzodiazepine, accidental or unintentional, initial encounter Long Term Goal(s): Improvement in symptoms so as ready for discharge  Short Term Goals: Ability to verbalize feelings will improve and Ability to disclose and discuss suicidal ideas  Physician Treatment Plan for Secondary Diagnosis: Principal Problem:   Overdose of benzodiazepine, accidental or  unintentional, initial encounter Active Problems:   Hip pain  Long Term Goal(s): Improvement in symptoms so as ready for discharge  Short Term Goals: Ability to identify triggers associated with substance abuse/mental health issues will improve  I certify that inpatient services furnished can reasonably be expected to improve the patient's condition.    Mordecai Rasmussen, MD 4/3/202012:10 PM

## 2018-09-06 NOTE — [facility] Suicide Risk Assessment (Signed)
[facility] INPATIENT:  Family/Significant Other Suicide Prevention Education  Suicide Prevention Education:  Patient Refusal for Family/Significant Other Suicide Prevention Education: The patient Kristine Griffin has refused to provide written consent for family/significant other to be provided Family/Significant Other Suicide Prevention Education during admission and/or prior to discharge.  Physician notified.  SPE completed with pt, as pt refused to consent to family contact. SPI pamphlet provided to pt and pt was encouraged to share information with support network, ask questions, and talk about any concerns relating to SPE. Pt denies access to guns/firearms and verbalized understanding of information provided. Mobile Crisis information also provided to pt.   Charlann Lange Hallis Meditz MSW LCSW 09/06/2018, 10:08 AM

## 2018-09-06 NOTE — Progress Notes (Signed)
Recreation Therapy Notes  Date: 09/06/2018  Time: 9:30 am   Location: Craft room   Behavioral response: N/A   Intervention Topic: Leisure  Discussion/Intervention: Patient did not attend group.   Clinical Observations/Feedback:  Patient did not attend group.   Keigen Caddell LRT/CTRS        Alla Sloma 09/06/2018 11:09 AM

## 2018-09-06 NOTE — Progress Notes (Signed)
Patient ID: Kristine Griffin, female   DOB: 1990-10-15, 28 y.o.   MRN: [id number]   Discharge Note:  Patient denies SI/HI/AVH at this time. Discharge instructions, AVS and transition record gone over with patient. Patient agrees to comply with medication management, follow-up visit, and outpatient therapy. Patient belongings returned to patient. Patient questions and concerns addressed and answered. Patient ambulatory off unit. Patient discharged to home with aunt.
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
## 2018-09-06 NOTE — Discharge Summary (Signed)
Physician Discharge Summary Note  Patient:  Kristine Griffin is an 28 y.o., female MRN:  [id number] DOB:  Oct 20, 1990 Patient phone:  [phone] (home)  Patient address:   [address] [place] [id number],  Total Time spent with patient: 1 hour  Date of Admission:  09/05/2018 Date of Discharge: September 06, 2018  Reason for Admission: Admitted after stabilization on the medical service because of presentation with an overdose of benzodiazepines  Principal Problem: Overdose of benzodiazepine, accidental or unintentional, initial encounter Discharge Diagnoses: Principal Problem:   Overdose of benzodiazepine, accidental or unintentional, initial encounter Active Problems:   Hip pain   Past Psychiatric History: Patient has a longstanding history of substance abuse and a history of prior accidental overdoses.  Currently engaged and appropriate outpatient substance abuse treatment through [facility].  No known actual suicide attempts  Past Medical History:  Past Medical History:  Diagnosis Date  . BV (bacterial vaginosis)   . Hip dislocation, right (HCC)    x2  . Migraine without aura    History reviewed. No pertinent surgical history. Family History:  Family History  Problem Relation Age of Onset  . Diabetes Mellitus II Maternal Grandmother   . Diabetes Mellitus II Maternal Grandfather    Family Psychiatric  History: Multiple people with substance abuse problems Social History:  Social History   Substance and Sexual Activity  Alcohol Use No  . Frequency: Never     Social History   Substance and Sexual Activity  Drug Use Yes   Comment: H/o drug treatment    Social History   Socioeconomic History  . Marital status: Single    Spouse name: Not on file  . Number of children: Not on file  . Years of education: Not on file  . Highest education level: Not on file  Occupational History  . Not on file  Social Needs  . Financial resource strain: Not on file  . Food  insecurity:    Worry: Not on file    Inability: Not on file  . Transportation needs:    Medical: Not on file    Non-medical: Not on file  Tobacco Use  . Smoking status: Current Every Day Smoker  . Smokeless tobacco: Never Used  Substance and Sexual Activity  . Alcohol use: No    Frequency: Never  . Drug use: Yes    Comment: H/o drug treatment  . Sexual activity: Not Currently    Birth control/protection: I.U.D.    Comment: Mirena  Lifestyle  . Physical activity:    Days per week: Not on file    Minutes per session: Not on file  . Stress: Not on file  Relationships  . Social connections:    Talks on phone: Not on file    Gets together: Not on file    Attends religious service: Not on file    Active member of club or organization: Not on file    Attends meetings of clubs or organizations: Not on file    Relationship status: Not on file  Other Topics Concern  . Not on file  Social History Narrative  . Not on file    Hospital Course: Patient was evaluated by treatment team including nursing social work and myself.  Patient has consistently denied any suicidal ideation.  Presents with appropriate affect and appropriate thought content.  No sign of psychotic or disorganized thinking.  Consistently denies suicidal thoughts and has very positive plans for treatment and for future events in  her life.  No evidence that she meets commitment criteria.  No evidence that she requires further inpatient hospitalization.  She was briefly engaged in groups and individual counseling and was given psychoeducation and counseling about her substance abuse problems and had an opportunity to meet with the liaison from [facility].  Patient is being discharged today back to her home with follow-up with her already engaged program at [facility].  No prescriptions given.  Physical Findings: AIMS:  , ,  ,  ,    CIWA:    COWS:     Musculoskeletal: Strength & Muscle Tone: within normal limits Gait & Station:  normal Patient leans: N/A  Psychiatric Specialty Exam: Physical Exam  Nursing note and vitals reviewed. Constitutional: She appears well-developed and well-nourished.  HENT:  Head: Normocephalic and atraumatic.  Eyes: Pupils are equal, round, and reactive to light. Conjunctivae are normal.  Neck: Normal range of motion.  Cardiovascular: Regular rhythm and normal heart sounds.  Respiratory: Effort normal.  GI: Soft.  Musculoskeletal: Normal range of motion.  Neurological: She is alert.  Skin: Skin is warm and dry.  Psychiatric: She has a normal mood and affect. Her behavior is normal. Judgment and thought content normal.    Review of Systems  Constitutional: Negative.   HENT: Negative.   Eyes: Negative.   Respiratory: Negative.   Cardiovascular: Negative.   Gastrointestinal: Negative.   Musculoskeletal: Negative.   Skin: Negative.   Neurological: Negative.   Psychiatric/Behavioral: Positive for substance abuse. Negative for depression, hallucinations, memory loss and suicidal ideas. The patient is not nervous/anxious and does not have insomnia.     Blood pressure (!) 130/94, pulse 84, temperature 97.6 F (36.4 C), temperature source Oral, resp. rate 17, height 5\' 1"  (1.549 m), weight 73 kg, SpO2 100 %.Body mass index is 30.42 kg/m.  General Appearance: Fairly Groomed  Eye Contact:  Good  Speech:  Clear and Coherent  Volume:  Normal  Mood:  Euthymic  Affect:  Appropriate  Thought Process:  Goal Directed  Orientation:  Full (Time, Place, and Person)  Thought Content:  Logical  Suicidal Thoughts:  No  Homicidal Thoughts:  No  Memory:  Immediate;   Fair Recent;   Fair Remote;   Fair  Judgement:  Fair  Insight:  Fair  Psychomotor Activity:  Normal  Concentration:  Concentration: Fair  Recall:  [employer] of Knowledge:  Fair  Language:  Fair  Akathisia:  No  Handed:  Right  AIMS (if indicated):     Assets:  Desire for Improvement  ADL's:  Intact  Cognition:  WNL   Sleep:  Number of Hours: 5     Have you used any form of tobacco in the last 30 days? (Cigarettes, Smokeless Tobacco, Cigars, and/or Pipes): Yes  Has this patient used any form of tobacco in the last 30 days? (Cigarettes, Smokeless Tobacco, Cigars, and/or Pipes) Yes, Yes, A prescription for an FDA-approved tobacco cessation medication was offered at discharge and the patient refused  Blood Alcohol level:  Lab Results  Component Value Date   [facility] <10 09/04/2018   ETH <10 01/04/2018    Metabolic Disorder Labs:  Lab Results  Component Value Date   HGBA1C 5.4 02/10/2016   No results found for: PROLACTIN No results found for: CHOL, TRIG, HDL, CHOLHDL, VLDL, LDLCALC  See Psychiatric Specialty Exam and Suicide Risk Assessment completed by Attending Physician prior to discharge.  Discharge destination:  Home  Is patient on multiple antipsychotic therapies at discharge:  No   Has Patient had three or more failed trials of antipsychotic monotherapy by history:  No  Recommended Plan for Multiple Antipsychotic Therapies: NA  Discharge Instructions    Diet - low sodium heart healthy   Complete by:  As directed    Increase activity slowly   Complete by:  As directed      Allergies as of 09/06/2018   No Known Allergies     Medication List    TAKE these medications     Indication  gabapentin 300 MG capsule Commonly known as:  NEURONTIN Take 2 capsules (600 mg total) by mouth 3 (three) times daily.  Indication:  Neuropathic Pain   Norethindrone Acetate-Ethinyl Estrad-FE 1-20 MG-MCG(24) tablet Commonly known as:  Microgestin 24 Fe Take 1 tablet by mouth daily.  Indication:  Birth Control Treatment   Suboxone 8-2 MG Film Generic drug:  Buprenorphine HCl-Naloxone HCl Place 1.5 Film under the tongue daily.  Indication:  Opioid Dependence      Follow-up Information    [facility] Follow up.   Why:  Walk in hours are Monday-Friday from 9:00AM-4:00PM.  Please call when you arrive as they are limiting number of people insude at a time. Please bring insurance information and current medication list with you. Thank You! Contact information: [address] [place] [address] [phone]           Follow-up recommendations:  Activity:  Activity as tolerated Diet:  Regular diet Other:  Follow-up outpatient treatment  Comments: Patient already engaged with [facility] and will continue her Suboxone treatment and treatment with the stepdown program through [facility].  Signed: Mordecai Rasmussen, MD 09/06/2018, 12:19 PM

## 2018-09-06 NOTE — Progress Notes (Signed)
Recreation Therapy Notes  INPATIENT RECREATION TR PLAN  Patient Details Name: Kristine Griffin MRN: [id number] DOB: 07-19-1990 Today's Date: 09/06/2018  Rec Therapy Plan Is patient appropriate for Therapeutic Recreation?: Yes Treatment times per week: at least 3 Estimated Length of Stay: 5-7 days TR Treatment/Interventions: Group participation (Comment)  Discharge Criteria Pt will be discharged from therapy if:: Discharged Treatment plan/goals/alternatives discussed and agreed upon by:: Patient/family  Discharge Summary Short term goals set: Patient will engage in groups without prompting or encouragement from LRT x3 group sessions within 5 recreation therapy group sessions Short term goals met: Not met Reason goals not met: Patient did not attend groups Therapeutic equipment acquired: N/A Reason patient discharged from therapy: Discharge from hospital Pt/family agrees with progress & goals achieved: Yes Date patient discharged from therapy: 09/06/18   Lailany Enoch 09/06/2018, 1:23 PM

## 2018-09-06 NOTE — Progress Notes (Signed)
Patient refused her Suboxone, stating that she received her dose last night "so late" so she wanted to wait a little bit longer before taking it. This Clinical research associate will notify MD.

## 2018-09-06 NOTE — BHH Group Notes (Signed)
LCSW Group Therapy Note  09/06/2018 1:00 PM  Type of Therapy and Topic:  Group Therapy:  Feelings around Relapse and Recovery  Participation Level:  Did Not Attend   Description of Group:    Patients in this group will discuss emotions they experience before and after a relapse. They will process how experiencing these feelings, or avoidance of experiencing them, relates to having a relapse. Facilitator will guide patients to explore emotions they have related to recovery. Patients will be encouraged to process which emotions are more powerful. They will be guided to discuss the emotional reaction significant others in their lives may have to their relapse or recovery. Patients will be assisted in exploring ways to respond to the emotions of others without this contributing to a relapse.  Therapeutic Goals: 1. Patient will identify two or more emotions that lead to a relapse for them 2. Patient will identify two emotions that result when they relapse 3. Patient will identify two emotions related to recovery 4. Patient will demonstrate ability to communicate their needs through discussion and/or role plays   Summary of Patient Progress: X    Therapeutic Modalities:   Cognitive Behavioral Therapy Solution-Focused Therapy Assertiveness Training Relapse Prevention Therapy   Penni Homans, MSW, LCSW 09/06/2018 2:30 PM

## 2018-09-06 NOTE — BHH Suicide Risk Assessment (Signed)
[facility] Admission Suicide Risk Assessment   Nursing information obtained from:  Patient, Review of record Demographic factors:  Adolescent or young adult, Caucasian Current Mental Status:  NA(Denies) Loss Factors:  Loss of significant relationship Historical Factors:  NA(denies) Risk Reduction Factors:  Responsible for children under 29 years of age, Sense of responsibility to family, Religious beliefs about death, Employed, Living with another person, especially a relative, Positive social support, Positive therapeutic relationship  Total Time spent with patient: 1 hour Principal Problem: <principal problem not specified> Diagnosis:  Active Problems:   Suicide attempt (HCC)  Subjective Data: Patient seen, chart reviewed.  Patient known from previous encounters.  Young woman with a history of substance abuse problems had a relapse on 2 benzodiazepines and came into the hospital because of oversedation.  Patient has consistently denied suicidal ideation or intent.  On interview today she is appropriate in her speech tone and affect.  Thoughts are clear.  Absolutely denies suicidal ideation.  Articulately talks about specific plans for the future.  No sign of psychosis.  No homicidal ideation.  Expresses a desire to continue with her outpatient treatment.  Expresses remorse for the current events.  Continued Clinical Symptoms:  Alcohol Use Disorder Identification Test Final Score (AUDIT): 0 The "Alcohol Use Disorders Identification Test", Guidelines for Use in Primary Care, Second Edition.  World Science writer [facility]). Score between 0-7:  no or low risk or alcohol related problems. Score between 8-15:  moderate risk of alcohol related problems. Score between 16-19:  high risk of alcohol related problems. Score 20 or above:  warrants further diagnostic evaluation for alcohol dependence and treatment.   CLINICAL FACTORS:   Alcohol/Substance Abuse/Dependencies   Musculoskeletal: Strength &  Muscle Tone: within normal limits Gait & Station: normal Patient leans: N/A  Psychiatric Specialty Exam: Physical Exam  Nursing note and vitals reviewed. Constitutional: She appears well-developed and well-nourished.  HENT:  Head: Normocephalic and atraumatic.  Eyes: Pupils are equal, round, and reactive to light. Conjunctivae are normal.  Neck: Normal range of motion.  Cardiovascular: Regular rhythm and normal heart sounds.  Respiratory: Effort normal.  GI: Soft.  Musculoskeletal: Normal range of motion.  Neurological: She is alert.  Skin: Skin is warm and dry.  Psychiatric: She has a normal mood and affect. Her behavior is normal. Judgment and thought content normal.    Review of Systems  Constitutional: Negative.   HENT: Negative.   Eyes: Negative.   Respiratory: Negative.   Cardiovascular: Negative.   Gastrointestinal: Negative.   Musculoskeletal: Negative.   Skin: Negative.   Neurological: Negative.   Psychiatric/Behavioral: Positive for depression and substance abuse. Negative for hallucinations, memory loss and suicidal ideas. The patient is nervous/anxious. The patient does not have insomnia.     Blood pressure (!) 130/94, pulse 84, temperature 97.6 F (36.4 C), temperature source Oral, resp. rate 17, height 5\' 1"  (1.549 m), weight 73 kg, SpO2 100 %.Body mass index is 30.42 kg/m.  General Appearance: Fairly Groomed  Eye Contact:  Good  Speech:  Clear and Coherent  Volume:  Normal  Mood:  Euthymic  Affect:  Appropriate  Thought Process:  Goal Directed  Orientation:  Full (Time, Place, and Person)  Thought Content:  Logical  Suicidal Thoughts:  No  Homicidal Thoughts:  No  Memory:  Immediate;   Fair Recent;   Fair Remote;   Fair  Judgement:  Fair  Insight:  Fair  Psychomotor Activity:  Normal  Concentration:  Concentration: Fair  Recall:  Fair  Fund of Knowledge:  Fair  Language:  Fair  Akathisia:  Negative  Handed:  Right  AIMS (if indicated):      Assets:  Communication Skills Desire for Improvement Housing Physical Health Resilience Social Support  ADL's:  Intact  Cognition:  WNL  Sleep:  Number of Hours: 5      COGNITIVE FEATURES THAT CONTRIBUTE TO RISK:  Loss of executive function    SUICIDE RISK:   Minimal: No identifiable suicidal ideation.  Patients presenting with no risk factors but with morbid ruminations; may be classified as minimal risk based on the severity of the depressive symptoms  PLAN OF CARE: Patient seen.  Chart reviewed.  Patient appears to have accidentally overdosed but there is no evidence that she was trying to kill her self.  Absolutely denies suicidal ideation now.  Appears to be lucid and without symptoms of major depression or psychosis.  No indication to support continued IVCD.  Patient has been evaluated by treatment team including social work and nursing.  She already has discharged services and follow-up services in place through [facility].  She will be discharged today to continue treatment immediately with her usual outpatient supports.  I certify that inpatient services furnished can reasonably be expected to improve the patient's condition.   Mordecai Rasmussen, MD 09/06/2018, 12:03 PM

## 2018-09-06 NOTE — [facility] Suicide Risk Assessment (Signed)
[facility] Discharge Suicide Risk Assessment   Principal Problem: Overdose of benzodiazepine, accidental or unintentional, initial encounter Discharge Diagnoses: Principal Problem:   Overdose of benzodiazepine, accidental or unintentional, initial encounter Active Problems:   Hip pain   Total Time spent with patient: 1 hour  Musculoskeletal: Strength & Muscle Tone: within normal limits Gait & Station: normal Patient leans: N/A  Psychiatric Specialty Exam: Review of Systems  Constitutional: Negative.   HENT: Negative.   Eyes: Negative.   Respiratory: Negative.   Cardiovascular: Negative.   Gastrointestinal: Negative.   Musculoskeletal: Negative.   Skin: Negative.   Neurological: Negative.   Psychiatric/Behavioral: Positive for substance abuse. Negative for depression, hallucinations, memory loss and suicidal ideas. The patient is not nervous/anxious and does not have insomnia.     Blood pressure (!) 130/94, pulse 84, temperature 97.6 F (36.4 C), temperature source Oral, resp. rate 17, height 5\' 1"  (1.549 m), weight 73 kg, SpO2 100 %.Body mass index is 30.42 kg/m.  General Appearance: Fairly Groomed  Patent attorney::  Good  Speech:  Clear and Coherent409  Volume:  Normal  Mood:  Euthymic  Affect:  Appropriate and Full Range  Thought Process:  Goal Directed  Orientation:  Full (Time, Place, and Person)  Thought Content:  Logical  Suicidal Thoughts:  No  Homicidal Thoughts:  No  Memory:  Immediate;   Fair Recent;   Fair Remote;   Fair  Judgement:  Fair  Insight:  Fair  Psychomotor Activity:  Normal  Concentration:  Fair  Recall:  [employer] of Knowledge:Fair  Language: Fair  Akathisia:  No  Handed:  Right  AIMS (if indicated):     Assets:  Desire for Improvement Financial Resources/Insurance Housing Physical Health Resilience Social Support  Sleep:  Number of Hours: 5  Cognition: WNL  ADL's:  Intact   Mental Status Per Nursing Assessment::   On Admission:   NA(Denies)  Demographic Factors:  Divorced or widowed and Caucasian  Loss Factors: Loss of significant relationship  Historical Factors: Impulsivity  Risk Reduction Factors:   Responsible for children under 55 years of age, Sense of responsibility to family, Religious beliefs about death, Employed, Positive social support, Positive therapeutic relationship and Positive coping skills or problem solving skills  Continued Clinical Symptoms:  Alcohol/Substance Abuse/Dependencies  Cognitive Features That Contribute To Risk:  Loss of executive function    Suicide Risk:  Minimal: No identifiable suicidal ideation.  Patients presenting with no risk factors but with morbid ruminations; may be classified as minimal risk based on the severity of the depressive symptoms  Follow-up Information    [facility] [employer] Follow up.   Why:  Walk in hours are Monday-Friday from 9:00AM-4:00PM. Please call when you arrive as they are limiting number of people insude at a time. Please bring insurance information and current medication list with you. Thank You! Contact information: [address] [place] [address] [phone]           Plan Of Care/Follow-up recommendations:  Activity:  Activity as tolerated Diet:  Regular diet Other:  Follow-up with [facility]  Mordecai Rasmussen, MD 09/06/2018, 12:08 PM

## 2018-09-06 NOTE — Progress Notes (Signed)
Recreation Therapy Notes  INPATIENT RECREATION THERAPY ASSESSMENT  Patient Details Name: Kristine Griffin MRN: [id number] DOB: [birth year] Today's Date: 09/06/2018       Information Obtained From: Patient  Able to Participate in Assessment/Interview: Yes  Patient Presentation: Responsive  Reason for Admission (Per Patient): Active Symptoms, Substance Abuse, Other (Comments)(Driving)  Patient Stressors:    Coping Skills:   Substance Abuse, Write, Art, Read  Leisure Interests (2+):  Social - Family(Cleaning, Tree surgeon)  Frequency of Recreation/Participation: Weekly  Awareness of Community Resources:  Yes  Community Resources:  Park, Other (Comment)(NA Meeting)  Current Use:    If no, Barriers?:    Expressed Interest in [employer] Information:    [place] of Residence:  Film/video editor  Patient Main Form of Transportation: Set designer  Patient Strengths:  Good mom, Trust worthy, Loyal  Patient Identified Areas of Improvement:  Patience, Persistance  Patient Goal for Hospitalization:  To get out and make a facebook page to get the word out.  Current SI (including self-harm):  No  Current HI:  No  Current AVH: No  Staff Intervention Plan: Group Attendance, Collaborate with Interdisciplinary Treatment Team  Consent to Intern Participation: N/A  Williard Keller 09/06/2018, 11:45 AM

## 2018-09-16 ENCOUNTER — Other Ambulatory Visit: Payer: Self-pay | Admitting: Obstetrics and Gynecology

## 2018-09-16 ENCOUNTER — Encounter: Payer: Self-pay | Admitting: Obstetrics and Gynecology

## 2018-09-16 DIAGNOSIS — B9689 Other specified bacterial agents as the cause of diseases classified elsewhere: Secondary | ICD-10-CM

## 2018-09-16 DIAGNOSIS — N760 Acute vaginitis: Principal | ICD-10-CM

## 2018-09-16 MED ORDER — CLINDAMYCIN HCL 300 MG PO CAPS: 300.0000 mg | ORAL_CAPSULE | Freq: Two times a day (BID) | ORAL | 0 refills | Status: DC

## 2018-11-19 ENCOUNTER — Other Ambulatory Visit: Payer: Self-pay | Admitting: Obstetrics and Gynecology

## 2018-11-19 ENCOUNTER — Telehealth: Payer: Self-pay

## 2018-11-19 DIAGNOSIS — B9689 Other specified bacterial agents as the cause of diseases classified elsewhere: Secondary | ICD-10-CM

## 2018-11-19 MED ORDER — CLINDAMYCIN HCL 300 MG PO CAPS: 300.0000 mg | ORAL_CAPSULE | Freq: Two times a day (BID) | ORAL | 0 refills | Status: AC

## 2018-11-19 NOTE — Telephone Encounter (Signed)
Called pt, spoke to Kristine Griffin, [facility].

## 2018-11-19 NOTE — Telephone Encounter (Signed)
Rx eRxd. Pls notify pt and tell her it's imperative she start the boric acid supp QHS for a month to help prevent the sx. Thx.

## 2018-11-19 NOTE — Telephone Encounter (Signed)
You want me to call pt to get details, I see shes been requesting it about every month.

## 2018-11-19 NOTE — Telephone Encounter (Signed)
Pt calling for refill of clindomycin be sent to [facility] at [place], [place].  [phone]

## 2018-11-20 NOTE — Telephone Encounter (Signed)
Per Carloyn Jaeger at front desk, pt called stating she needed the rx to go to [facility]. Pt advised that [facility] would [facility].

## 2018-11-20 NOTE — Telephone Encounter (Signed)
Sp[facility] [place]. Verbal given for Clindamycin

## 2019-07-23 ENCOUNTER — Encounter: Payer: Self-pay | Admitting: Obstetrics and Gynecology

## 2019-07-24 ENCOUNTER — Other Ambulatory Visit: Payer: Self-pay | Admitting: Obstetrics and Gynecology

## 2019-07-24 MED ORDER — METRONIDAZOLE 500 MG PO TABS: 500.0000 mg | ORAL_TABLET | Freq: Two times a day (BID) | ORAL | 0 refills | Status: AC

## 2019-07-24 MED ORDER — METRONIDAZOLE 0.75 % VA GEL: VAGINAL | 1 refills | Status: DC

## 2019-07-24 NOTE — Progress Notes (Signed)
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

## 2021-08-24 LAB — HM PAP SMEAR: HM Pap smear: NEGATIVE

## 2021-11-21 ENCOUNTER — Ambulatory Visit: Payer: Medicaid Other

## 2021-11-22 ENCOUNTER — Ambulatory Visit (LOCAL_COMMUNITY_HEALTH_CENTER): Payer: Medicaid Other | Admitting: Nurse Practitioner

## 2021-11-22 ENCOUNTER — Encounter: Payer: Self-pay | Admitting: Nurse Practitioner

## 2021-11-22 VITALS — BP 112/79 | Ht 61.0 in | Wt 148.6 lb

## 2021-11-22 DIAGNOSIS — Z0000 Encounter for general adult medical examination without abnormal findings: Secondary | ICD-10-CM | POA: Diagnosis not present

## 2021-11-22 DIAGNOSIS — Z3009 Encounter for other general counseling and advice on contraception: Secondary | ICD-10-CM | POA: Diagnosis not present

## 2021-11-22 MED ORDER — NORGESTIMATE-ETH ESTRADIOL 0.25-35 MG-MCG PO TABS: 1.0000 | ORAL_TABLET | Freq: Every day | ORAL | 12 refills | Status: DC

## 2021-11-22 MED ORDER — LEVONORGESTREL 1.5 MG PO TABS: 1.5000 mg | ORAL_TABLET | Freq: Once | ORAL | 0 refills | Status: AC

## 2021-11-22 NOTE — Progress Notes (Signed)
[facility] DEPARTMENT [facility] [address] Main Number: [phone]    Family Planning Visit- Initial Visit  Subjective:  Kristine Griffin is a 31 y.o.  G1P1001   being seen today for an initial annual visit and to discuss reproductive life planning.  The patient is currently using No Method - Other Reason for pregnancy prevention. Patient reports   does not want a pregnancy in the next year.     report they are looking for a method that provides Method they can control starting/stopping  Patient has the following medical conditions has Opiate abuse, episodic (HCC); Cocaine abuse (HCC); Hip pain; Depression; Overdose of muscle relaxant; Opiate abuse, continuous (HCC); Sedative abuse (HCC); Back pain; Benzodiazepine abuse (HCC); Overdose of benzodiazepine, accidental or unintentional, initial encounter; and Suicide attempt [facility]) on their problem list.  Chief Complaint  Patient presents with   Annual Exam    PE, PAP, STI screening, and OPC    Patient reports to clinic today for a physical and birth control.     Body mass index is 28.08 kg/m. - Patient is eligible for diabetes screening based on BMI and age >50?  not applicable HA1C ordered? not applicable  Patient reports 3  partner/s in last year. Desires STI screening?  No - refused   Has patient been screened once for HCV in the past?  No  No results found for: "HCVAB"  Does the patient have current drug use (including MJ), have a partner with drug use, and/or has been incarcerated since last result? Yes  If yes-- Screen for HCV through [facility]   Does the patient meet criteria for HBV testing? Yes, patient refused   Criteria:  -Household, sexual or needle sharing contact with HBV -History of drug use -HIV positive -Those with known Hep C   Health Maintenance Due  Topic Date Due   URINE MICROALBUMIN  Never done   HIV Screening  Never done   Hepatitis C Screening  Never  done   COVID-19 Vaccine (2 - Moderna series) 01/09/2020   PAP SMEAR-Modifier  02/19/2021    Review of Systems  Constitutional:  Positive for weight loss. Negative for chills, fever and malaise/fatigue.  HENT:  Negative for congestion, hearing loss and sore throat.   Eyes:  Negative for blurred vision, double vision and photophobia.  Respiratory:  Negative for shortness of breath.        Sore throat   Cardiovascular:  Negative for chest pain.  Gastrointestinal:  Negative for abdominal pain, blood in stool, constipation, diarrhea, heartburn, nausea and vomiting.  Genitourinary:  Negative for dysuria and frequency.  Musculoskeletal:  Negative for back pain, joint pain and neck pain.  Skin:  Negative for itching and rash.  Neurological:  Negative for dizziness, weakness and headaches.  Endo/Heme/Allergies:  Does not bruise/bleed easily.  Psychiatric/Behavioral:  Negative for depression, substance abuse and suicidal ideas.     The following portions of the patient's history were reviewed and updated as appropriate: allergies, current medications, past family history, past medical history, past social history, past surgical history and problem list. Problem list updated.   See flowsheet for other program required questions.  Objective:   Vitals:   11/22/21 1530  BP: 112/79  Weight: 148 lb 9.6 oz (67.4 kg)  Height: 5\' 1"  (1.549 m)    Physical Exam Constitutional:      Appearance: Normal appearance.  HENT:     Head: Normocephalic. No abrasion, masses or  laceration. Hair is normal.     Jaw: No tenderness or swelling.     Right Ear: External ear normal.     Left Ear: External ear normal.     Nose: Nose normal.     Mouth/Throat:     Lips: Pink. No lesions.     Mouth: Mucous membranes are moist. No lacerations or oral lesions.     Dentition: No dental caries.     Tongue: No lesions.     Palate: No mass and lesions.     Pharynx: No pharyngeal swelling, oropharyngeal exudate,  posterior oropharyngeal erythema or uvula swelling.     Tonsils: No tonsillar exudate or tonsillar abscesses.  Eyes:     Pupils: Pupils are equal, round, and reactive to light.  Neck:     Thyroid: No thyroid mass, thyromegaly or thyroid tenderness.  Cardiovascular:     Rate and Rhythm: Normal rate and regular rhythm.  Pulmonary:     Effort: Pulmonary effort is normal.     Breath sounds: Normal breath sounds.  Chest:  Breasts:    Right: Normal. No swelling, mass, nipple discharge, skin change or tenderness.     Left: Normal. No swelling, mass, nipple discharge, skin change or tenderness.  Abdominal:     General: Abdomen is flat. Bowel sounds are normal.     Palpations: Abdomen is soft.     Tenderness: There is no abdominal tenderness. There is no rebound.  Genitourinary:    Comments: Deferred, patient declines genital exam  Musculoskeletal:     Cervical back: Full passive range of motion without pain and normal range of motion.  Lymphadenopathy:     Cervical: No cervical adenopathy.     Right cervical: No superficial, deep or posterior cervical adenopathy.    Left cervical: No superficial, deep or posterior cervical adenopathy.     Upper Body:     Right upper body: No supraclavicular, axillary or epitrochlear adenopathy.     Left upper body: No supraclavicular, axillary or epitrochlear adenopathy.  Skin:    General: Skin is warm and dry.     Findings: No erythema, laceration, lesion or rash.  Neurological:     Mental Status: She is alert and oriented to person, place, and time.  Psychiatric:        Attention and Perception: Attention normal.        Mood and Affect: Mood normal.        Speech: Speech normal.        Behavior: Behavior normal. Behavior is cooperative.       Assessment and Plan:  Kristine Griffin is a 31 y.o. female presenting to the [facility] Department for an initial annual wellness/contraceptive visit  Contraception counseling: Reviewed  options based on patient desire and reproductive life plan. Patient is interested in Oral Contraceptive. This was provided to the patient today.    Risks, benefits, and typical effectiveness rates were reviewed.  Questions were answered.  Written information was also given to the patient to review.    The patient will follow up in  1 years for surveillance.  The patient was told to call with any further questions, or with any concerns about this method of contraception.  Emphasized use of condoms 100% of the time for STI prevention.  Need for ECP was assessed.  Patient was prescribed ECP.    1. Family planning counseling -31 year old female in clinic today for a physical and birth control. -ROS reviewed.  Patient states she recently was diagnosed with diabetes which was the cause of her weight loss.  No other complaints noted other than sore throat. -STD screening offered to patient today.  Patient declined all STD screening and blood work.  Patient inquired about herpes testing, due to being a potential contact.  Patient denies current signs and symptoms of herpes  Informed patient that cultures are only collected if a lesion is present.   -Patient desires to have OCPs as a birth control method. Plan B also submitted to pharmacy.  Last sex 11/21/21.  - norgestimate-ethinyl estradiol (ORTHO-CYCLEN) 0.25-35 MG-MCG tablet; Take 1 tablet by mouth daily.  Dispense: 28 tablet; Refill: 12 - levonorgestrel (PLAN B ONE-STEP) 1.5 MG tablet; Take 1 tablet (1.5 mg total) by mouth once for 1 dose.  Dispense: 1 tablet; Refill: 0   2. Well woman exam (no gynecological exam) -Normal well woman exam. -CBE today, next due 11/2024 -Patient states PAP was completed at [facility] in 2022.  ROI form completed to obtain PAP results.     Return in about 1 year (around 11/23/2022) for Annual well-woman exam.    Glenna Fellows, FNP

## 2021-11-22 NOTE — Progress Notes (Signed)
Pt does not desire STD testing today. PE & OCP only. Consent signed and faxed for medical records release to [facility] of her most recent PAP from last year according to pt. Estrogen Contraceptives consent signed. Condoms declined.  Lethea Killings RN

## 2022-01-02 ENCOUNTER — Encounter: Payer: Self-pay | Admitting: Nurse Practitioner

## 2022-03-01 ENCOUNTER — Ambulatory Visit: Payer: Medicaid Other

## 2022-12-13 ENCOUNTER — Other Ambulatory Visit: Payer: Self-pay | Admitting: Family Medicine

## 2022-12-13 DIAGNOSIS — R600 Localized edema: Secondary | ICD-10-CM

## 2022-12-14 ENCOUNTER — Ambulatory Visit: Admission: RE | Admit: 2022-12-14 | Payer: MEDICAID | Source: Ambulatory Visit

## 2023-08-24 ENCOUNTER — Other Ambulatory Visit: Payer: Self-pay | Admitting: Orthopedic Surgery

## 2023-08-24 DIAGNOSIS — M1611 Unilateral primary osteoarthritis, right hip: Secondary | ICD-10-CM

## 2023-08-24 DIAGNOSIS — Q6589 Other specified congenital deformities of hip: Secondary | ICD-10-CM

## 2023-09-05 ENCOUNTER — Other Ambulatory Visit

## 2023-09-06 ENCOUNTER — Other Ambulatory Visit: Payer: Self-pay | Admitting: Orthopedic Surgery

## 2023-09-07 ENCOUNTER — Other Ambulatory Visit: Payer: Self-pay | Admitting: Orthopedic Surgery

## 2023-09-07 DIAGNOSIS — Q6589 Other specified congenital deformities of hip: Secondary | ICD-10-CM

## 2023-09-07 DIAGNOSIS — M1611 Unilateral primary osteoarthritis, right hip: Secondary | ICD-10-CM

## 2023-09-09 ENCOUNTER — Other Ambulatory Visit

## 2023-09-16 ENCOUNTER — Ambulatory Visit
Admission: RE | Admit: 2023-09-16 | Discharge: 2023-09-16 | Disposition: A | Discharge status: Home / Self Care | Payer: MEDICAID | Source: Ambulatory Visit | Attending: Orthopedic Surgery | Admitting: Orthopedic Surgery

## 2023-09-16 DIAGNOSIS — Q6589 Other specified congenital deformities of hip: Secondary | ICD-10-CM | POA: Insufficient documentation

## 2023-09-16 DIAGNOSIS — M1611 Unilateral primary osteoarthritis, right hip: Secondary | ICD-10-CM | POA: Diagnosis present

## 2023-09-18 ENCOUNTER — Encounter
Admission: RE | Admit: 2023-09-18 | Discharge: 2023-09-18 | Disposition: A | Discharge status: Home / Self Care | Payer: MEDICAID | Source: Ambulatory Visit | Attending: Orthopedic Surgery | Admitting: Orthopedic Surgery

## 2023-09-18 VITALS — BP 112/80 | HR 72 | Temp 98.0°F | Resp 20 | Ht 63.0 in | Wt 151.8 lb

## 2023-09-18 DIAGNOSIS — M25551 Pain in right hip: Secondary | ICD-10-CM | POA: Insufficient documentation

## 2023-09-18 DIAGNOSIS — Z131 Encounter for screening for diabetes mellitus: Secondary | ICD-10-CM | POA: Diagnosis not present

## 2023-09-18 DIAGNOSIS — Z01812 Encounter for preprocedural laboratory examination: Secondary | ICD-10-CM | POA: Diagnosis not present

## 2023-09-18 DIAGNOSIS — Z8632 Personal history of gestational diabetes: Secondary | ICD-10-CM | POA: Diagnosis not present

## 2023-09-18 DIAGNOSIS — Q6589 Other specified congenital deformities of hip: Secondary | ICD-10-CM | POA: Insufficient documentation

## 2023-09-18 DIAGNOSIS — Z01818 Encounter for other preprocedural examination: Secondary | ICD-10-CM | POA: Insufficient documentation

## 2023-09-18 HISTORY — DX: Other specified congenital deformities of hip: Q65.89

## 2023-09-18 HISTORY — DX: Opioid dependence, uncomplicated: F11.20

## 2023-09-18 LAB — TYPE AND SCREEN
ABO/RH(D): A POS
Antibody Screen: NEGATIVE

## 2023-09-18 LAB — COMPREHENSIVE METABOLIC PANEL WITH GFR
ALT: 17 U/L (ref 0–44)
AST: 20 U/L (ref 15–41)
Albumin: 4 g/dL (ref 3.5–5.0)
Alkaline Phosphatase: 55 U/L (ref 38–126)
Anion gap: 7 (ref 5–15)
BUN: 12 mg/dL (ref 6–20)
CO2: 24 mmol/L (ref 22–32)
Calcium: 9 mg/dL (ref 8.9–10.3)
Chloride: 104 mmol/L (ref 98–111)
Creatinine, Ser: 0.67 mg/dL (ref 0.44–1.00)
GFR, Estimated: >60 mL/min (ref >60)
Glucose, Bld: 103 mg/dL — ABNORMAL HIGH (ref 70–99)
Potassium: 3.4 mmol/L — ABNORMAL LOW (ref 3.5–5.1)
Sodium: 135 mmol/L (ref 135–145)
Total Bilirubin: 0.5 mg/dL (ref 0.0–1.2)
Total Protein: 7.6 g/dL (ref 6.5–8.1)

## 2023-09-18 LAB — CBC WITH DIFFERENTIAL/PLATELET
Abs Immature Granulocytes: 0.02 10*3/uL (ref 0.00–0.07)
Basophils Absolute: 0 10*3/uL (ref 0.0–0.1)
Basophils Relative: 0 %
Eosinophils Absolute: 0.1 10*3/uL (ref 0.0–0.5)
Eosinophils Relative: 1 %
HCT: 39.3 % (ref 36.0–46.0)
Hemoglobin: 13.4 g/dL (ref 12.0–15.0)
Immature Granulocytes: 0 %
Lymphocytes Relative: 26 %
Lymphs Abs: 2.2 10*3/uL (ref 0.7–4.0)
MCH: 28.8 pg (ref 26.0–34.0)
MCHC: 34.1 g/dL (ref 30.0–36.0)
MCV: 84.3 fL (ref 80.0–100.0)
Monocytes Absolute: 0.4 10*3/uL (ref 0.1–1.0)
Monocytes Relative: 5 %
Neutro Abs: 5.5 10*3/uL (ref 1.7–7.7)
Neutrophils Relative %: 68 %
Platelets: 321 10*3/uL (ref 150–400)
RBC: 4.66 MIL/uL (ref 3.87–5.11)
RDW: 12.1 % (ref 11.5–15.5)
WBC: 8.2 10*3/uL (ref 4.0–10.5)
nRBC: 0 % (ref 0.0–0.2)

## 2023-09-18 LAB — URINALYSIS, ROUTINE W REFLEX MICROSCOPIC
Bacteria, UA: NONE SEEN
Bilirubin Urine: NEGATIVE
Glucose, UA: NEGATIVE mg/dL
Ketones, ur: NEGATIVE mg/dL
Leukocytes,Ua: NEGATIVE
Nitrite: NEGATIVE
Protein, ur: NEGATIVE mg/dL
Specific Gravity, Urine: 1.009 (ref 1.005–1.030)
pH: 7 (ref 5.0–8.0)

## 2023-09-18 LAB — HEMOGLOBIN A1C
Hgb A1c MFr Bld: 6 % — ABNORMAL HIGH (ref 4.8–5.6)
Mean Plasma Glucose: 125.5 mg/dL

## 2023-09-18 LAB — SURGICAL PCR SCREEN
MRSA, PCR: NEGATIVE
Staphylococcus aureus: NEGATIVE

## 2023-09-18 NOTE — Patient Instructions (Addendum)
 Your procedure is scheduled on: 10/01/2023 Monday Report to the Registration Desk on the 1st floor of the [facility]. To find out your arrival time, please call [phone] between 1PM - 3PM on: Friday, 09/28/2023  If your arrival time is 6:00 am, do not arrive before that time as the [facility] entrance doors do not open until 6:00 am.  REMEMBER: Instructions that are not followed completely may result in serious medical risk, up to and including death; or upon the discretion of your surgeon and anesthesiologist your surgery may need to be rescheduled.  Do not eat food after midnight the night before surgery.  No gum chewing or hard candies.  You may however, drink CLEAR liquids up to 2 hours before you are scheduled to arrive for your surgery. Do not drink anything within 2 hours of your scheduled arrival time.  Clear liquids include: - water  - apple juice without pulp - gatorade (not RED colors) - black coffee or tea (Do NOT add milk or creamers to the coffee or tea) Do NOT drink anything that is not on this list.    In addition, your doctor has ordered for you to drink the provided:  Ensure Pre-Surgery Clear Carbohydrate Drink   Drinking this carbohydrate drink up to two hours before surgery helps to reduce insulin resistance and improve patient outcomes. Please complete drinking 2 hours before scheduled arrival time.  One week prior to surgery: Stop Anti-inflammatories (NSAIDS) such as Advil, Aleve, Ibuprofen, Motrin, Naproxen, Naprosyn and Aspirin based products such as Excedrin, Goody's Powder, BC Powder. Stop ANY OVER THE COUNTER supplements until after surgery.  You may however, continue to take Tylenol if needed for pain up until the day of surgery.  Continue taking all of your other prescription medications up until the day of surgery.  ON THE DAY OF SURGERY ONLY TAKE THESE MEDICATIONS WITH SIPS OF WATER:  Suboxone-since you are doing narcotic post op. Follow Dr.  Ellene Route instructions   No Alcohol for 24 hours before or after surgery.  No Smoking including e-cigarettes for 24 hours before surgery.  No chewable tobacco products for at least 6 hours before surgery.  No nicotine patches on the day of surgery.  Do not use any "recreational" drugs for at least a week (preferably 2 weeks) before your surgery.  Please be advised that the combination of cocaine and anesthesia may have negative outcomes, up to and including death. If you test positive for cocaine, your surgery will be cancelled.  On the morning of surgery brush your teeth with toothpaste and water, you may rinse your mouth with mouthwash if you wish. Do not swallow any toothpaste or mouthwash.  Use CHG Soap as directed on instruction sheet.-provided for you   Do not wear jewelry, make-up, hairpins, clips or nail polish.  For welded (permanent) jewelry: bracelets, anklets, waist bands, etc.  Please have this removed prior to surgery.  If it is not removed, there is a chance that hospital personnel will need to cut it off on the day of surgery.  Do not wear lotions, powders, or perfumes.   Do not shave body hair from the neck down 48 hours before surgery.  Contact lenses, hearing aids and dentures may not be worn into surgery.  Do not bring valuables to the hospital. [facility] is not responsible for any missing/lost belongings or valuables.     Notify your doctor if there is any change in your medical condition (cold, fever, infection).  Wear comfortable clothing (specific to your surgery type) to the hospital.  After surgery, you can help prevent lung complications by doing breathing exercises.  Take deep breaths and cough every 1-2 hours. Your doctor may order a device called an Incentive Spirometer to help you take deep breaths.  If you are being admitted to the hospital overnight, leave your suitcase in the car. After surgery it may be brought to your room.  In case of  increased patient census, it may be necessary for you, the patient, to continue your postoperative care in the Same Day Surgery department.  If you are being discharged the day of surgery, you will not be allowed to drive home. You will need a responsible individual to drive you home and stay with you for 24 hours after surgery.     Please call the Pre-admissions Testing Dept. at [phone] if you have any questions about these instructions.  Surgery Visitation Policy:  Patients having surgery or a procedure may have two visitors.  Children under the age of 1 must have an adult with them who is not the patient.  Inpatient Visitation:    Visiting hours are 7 a.m. to 8 p.m. Up to four visitors are allowed at one time in a patient room. The visitors may rotate out with other people during the day.  One visitor age 24 or older may stay with the patient overnight and must be in the room by 8 p.m.     Pre-operative 5 CHG Bath Instructions   You can play a key role in reducing the risk of infection after surgery. Your skin needs to be as free of germs as possible. You can reduce the number of germs on your skin by washing with CHG (chlorhexidine gluconate) soap before surgery. CHG is an antiseptic soap that kills germs and continues to kill germs even after washing.   DO NOT use if you have an allergy to chlorhexidine/CHG or antibacterial soaps. If your skin becomes reddened or irritated, stop using the CHG and notify one of our RNs at [phone].   Please shower with the CHG soap starting 4 days before surgery using the following schedule:        Please keep in mind the following:  DO NOT shave, including legs and underarms, starting the day of your first shower.   You may shave your face at any point before/day of surgery.  Place clean sheets on your bed the day you start using CHG soap. Use a clean washcloth (not used since being washed) for each shower. DO NOT sleep with  pets once you start using the CHG.   CHG Shower Instructions:  If you choose to wash your hair and private area, wash first with your normal shampoo/soap.  After you use shampoo/soap, rinse your hair and body thoroughly to remove shampoo/soap residue.  Turn the water OFF and apply about 3 tablespoons (45 ml) of CHG soap to a CLEAN washcloth.  Apply CHG soap ONLY FROM YOUR NECK DOWN TO YOUR TOES (washing for 3-5 minutes)  DO NOT use CHG soap on face, private areas, open wounds, or sores.  Pay special attention to the area where your surgery is being performed.  If you are having back surgery, having someone wash your back for you may be helpful. Wait 2 minutes after CHG soap is applied, then you may rinse off the CHG soap.  Pat dry with a clean towel  Put on clean clothes/pajamas   If  you choose to wear lotion, please use ONLY the CHG-compatible lotions on the back of this paper.     Additional instructions for the day of surgery: DO NOT APPLY any lotions, deodorants, cologne, or perfumes.   Put on clean/comfortable clothes.  Brush your teeth.  Ask your nurse before applying any prescription medications to the skin.      CHG Compatible Lotions   Aveeno Moisturizing lotion  Cetaphil Moisturizing Cream  Cetaphil Moisturizing Lotion  Clairol Herbal Essence Moisturizing Lotion, Dry Skin  Clairol Herbal Essence Moisturizing Lotion, Extra Dry Skin  Clairol Herbal Essence Moisturizing Lotion, Normal Skin  Curel Age Defying Therapeutic Moisturizing Lotion with Alpha Hydroxy  Curel Extreme Care Body Lotion  Curel Soothing Hands Moisturizing Hand Lotion  Curel Therapeutic Moisturizing Cream, Fragrance-Free  Curel Therapeutic Moisturizing Lotion, Fragrance-Free  Curel Therapeutic Moisturizing Lotion, Original Formula  Eucerin Daily Replenishing Lotion  Eucerin Dry Skin Therapy Plus Alpha Hydroxy Crme  Eucerin Dry Skin Therapy Plus Alpha Hydroxy Lotion  Eucerin Original Crme  Eucerin  Original Lotion  Eucerin Plus Crme Eucerin Plus Lotion  Eucerin TriLipid Replenishing Lotion  Keri Anti-Bacterial Hand Lotion  Keri Deep Conditioning Original Lotion Dry Skin Formula Softly Scented  Keri Deep Conditioning Original Lotion, Fragrance Free Sensitive Skin Formula  Keri Lotion Fast Absorbing Fragrance Free Sensitive Skin Formula  Keri Lotion Fast Absorbing Softly Scented Dry Skin Formula  Keri Original Lotion  Keri Skin Renewal Lotion Keri Silky Smooth Lotion  Keri Silky Smooth Sensitive Skin Lotion  Nivea Body Creamy Conditioning Oil  Nivea Body Extra Enriched Lotion  Nivea Body Original Lotion  Nivea Body Sheer Moisturizing Lotion Nivea Crme  Nivea Skin Firming Lotion  NutraDerm 30 Skin Lotion  NutraDerm Skin Lotion  NutraDerm Therapeutic Skin Cream  NutraDerm Therapeutic Skin Lotion  ProShield Protective Hand Cream  Provon moisturizing lotion   How to Use an Incentive Spirometer  An incentive spirometer is a tool that measures how well you are filling your lungs with each breath. Learning to take long, deep breaths using this tool can help you keep your lungs clear and active. This may help to reverse or lessen your chance of developing breathing (pulmonary) problems, especially infection. You may be asked to use a spirometer: After a surgery. If you have a lung problem or a history of smoking. After a long period of time when you have been unable to move or be active. If the spirometer includes an indicator to show the highest number that you have reached, your health care provider or respiratory therapist will help you set a goal. Keep a log of your progress as told by your health care provider. What are the risks? Breathing too quickly may cause dizziness or cause you to pass out. Take your time so you do not get dizzy or light-headed. If you are in pain, you may need to take pain medicine before doing incentive spirometry. It is harder to take a deep breath if  you are having pain. How to use your incentive spirometer  Sit up on the edge of your bed or on a chair. Hold the incentive spirometer so that it is in an upright position. Before you use the spirometer, breathe out normally. Place the mouthpiece in your mouth. Make sure your lips are closed tightly around it. Breathe in slowly and as deeply as you can through your mouth, causing the piston or the ball to rise toward the top of the chamber. Hold your breath for 3-5 seconds, or  for as long as possible. If the spirometer includes a coach indicator, use this to guide you in breathing. Slow down your breathing if the indicator goes above the marked areas. Remove the mouthpiece from your mouth and breathe out normally. The piston or ball will return to the bottom of the chamber. Rest for a few seconds, then repeat the steps 10 or more times. Take your time and take a few normal breaths between deep breaths so that you do not get dizzy or light-headed. Do this every 1-2 hours when you are awake. If the spirometer includes a goal marker to show the highest number you have reached (best effort), use this as a goal to work toward during each repetition. After each set of 10 deep breaths, cough a few times. This will help to make sure that your lungs are clear. If you have an incision on your chest or abdomen from surgery, place a pillow or a rolled-up towel firmly against the incision when you cough. This can help to reduce pain while taking deep breaths and coughing. General tips When you are able to get out of bed: Walk around often. Continue to take deep breaths and cough in order to clear your lungs. Keep using the incentive spirometer until your health care provider says it is okay to stop using it. If you have been in the hospital, you may be told to keep using the spirometer at home. Contact a health care provider if: You are having difficulty using the spirometer. You have trouble using the  spirometer as often as instructed. Your pain medicine is not giving enough relief for you to use the spirometer as told. You have a fever. Get help right away if: You develop shortness of breath. You develop a cough with bloody mucus from the lungs. You have fluid or blood coming from an incision site after you cough. Summary An incentive spirometer is a tool that can help you learn to take long, deep breaths to keep your lungs clear and active. You may be asked to use a spirometer after a surgery, if you have a lung problem or a history of smoking, or if you have been inactive for a long period of time. Use your incentive spirometer as instructed every 1-2 hours while you are awake. If you have an incision on your chest or abdomen, place a pillow or a rolled-up towel firmly against your incision when you cough. This will help to reduce pain. Get help right away if you have shortness of breath, you cough up bloody mucus, or blood comes from your incision when you cough. This information is not intended to replace advice given to you by your health care provider. Make sure you discuss any questions you have with your health care provider. Document Revised: 08/11/2019 Document Reviewed: 08/11/2019 Elsevier Patient Education  2023 [employer]  Preoperative Educational Videos for Total Hip, Knee and Shoulder Replacements  To better prepare for surgery, please view our videos that explain the physical activity and discharge planning required to have the best surgical recovery at [facility]  [email or web address]  Questions? Call [phone] or email [email or web address]

## 2023-09-30 MED ORDER — CEFAZOLIN SODIUM-DEXTROSE 2-4 GM/100ML-% IV SOLN
2.0000 g | INTRAVENOUS | Status: AC
  Administered 2023-10-01: 2 g via INTRAVENOUS

## 2023-09-30 MED ORDER — ORAL CARE MOUTH RINSE: 15.0000 mL | Freq: Once | OROMUCOSAL | Status: AC

## 2023-09-30 MED ORDER — DEXAMETHASONE SODIUM PHOSPHATE 10 MG/ML IJ SOLN: 8.0000 mg | Freq: Once | INTRAMUSCULAR | Status: DC

## 2023-09-30 MED ORDER — CHLORHEXIDINE GLUCONATE 0.12 % MT SOLN
15.0000 mL | Freq: Once | OROMUCOSAL | Status: AC
  Administered 2023-10-01: 15 mL via OROMUCOSAL

## 2023-09-30 MED ORDER — LACTATED RINGERS IV SOLN
INTRAVENOUS | Status: DC
Start: 2023-09-30 — End: 2023-10-01

## 2023-09-30 MED ORDER — TRANEXAMIC ACID-NACL 1000-0.7 MG/100ML-% IV SOLN
1000.0000 mg | INTRAVENOUS | Status: AC
  Administered 2023-10-01 (×2): 1000 mg via INTRAVENOUS

## 2023-10-01 ENCOUNTER — Encounter
Admission: RE | Disposition: A | Discharge status: Home Health | Payer: Self-pay | Source: Home / Self Care | Attending: Orthopedic Surgery

## 2023-10-01 ENCOUNTER — Ambulatory Visit: Payer: MEDICAID | Admitting: Urgent Care

## 2023-10-01 ENCOUNTER — Other Ambulatory Visit: Payer: Self-pay

## 2023-10-01 ENCOUNTER — Encounter: Payer: Self-pay | Admitting: Orthopedic Surgery

## 2023-10-01 ENCOUNTER — Ambulatory Visit: Payer: MEDICAID | Admitting: General Practice

## 2023-10-01 ENCOUNTER — Ambulatory Visit
Admission: RE | Admit: 2023-10-01 | Discharge: 2023-10-02 | Disposition: A | Discharge status: Home Health | Payer: MEDICAID | Attending: Orthopedic Surgery | Admitting: Orthopedic Surgery

## 2023-10-01 ENCOUNTER — Ambulatory Visit: Payer: MEDICAID

## 2023-10-01 DIAGNOSIS — Z96641 Presence of right artificial hip joint: Secondary | ICD-10-CM | POA: Diagnosis present

## 2023-10-01 DIAGNOSIS — M1611 Unilateral primary osteoarthritis, right hip: Secondary | ICD-10-CM | POA: Diagnosis present

## 2023-10-01 DIAGNOSIS — Q6589 Other specified congenital deformities of hip: Secondary | ICD-10-CM | POA: Insufficient documentation

## 2023-10-01 HISTORY — PX: TOTAL HIP ARTHROPLASTY: SHX124

## 2023-10-01 LAB — ABO/RH: ABO/RH(D): A POS

## 2023-10-01 LAB — GLUCOSE, CAPILLARY: Glucose-Capillary: 136 mg/dL — ABNORMAL HIGH (ref 70–99)

## 2023-10-01 LAB — POCT PREGNANCY, URINE: Preg Test, Ur: NEGATIVE

## 2023-10-01 SURGERY — ARTHROPLASTY, HIP, TOTAL, ANTERIOR APPROACH
Anesthesia: Spinal | Site: Hip | Laterality: Right

## 2023-10-01 MED ORDER — ACETAMINOPHEN 325 MG PO TABS: 325.0000 mg | ORAL_TABLET | Freq: Four times a day (QID) | ORAL | Status: DC | PRN

## 2023-10-01 MED ORDER — EPHEDRINE SULFATE-NACL 50-0.9 MG/10ML-% IV SOSY
PREFILLED_SYRINGE | INTRAVENOUS | Status: DC | PRN
  Administered 2023-10-01: 5 mg via INTRAVENOUS

## 2023-10-01 MED ORDER — METOCLOPRAMIDE HCL 5 MG/ML IJ SOLN: 5.0000 mg | Freq: Three times a day (TID) | INTRAMUSCULAR | Status: DC | PRN

## 2023-10-01 MED ORDER — DEXAMETHASONE SODIUM PHOSPHATE 10 MG/ML IJ SOLN
INTRAMUSCULAR | Status: DC | PRN
  Administered 2023-10-01: 10 mg via INTRAVENOUS

## 2023-10-01 MED ORDER — MIDAZOLAM HCL 2 MG/2ML IJ SOLN
INTRAMUSCULAR | Status: AC
  Filled 2023-10-01: qty 2

## 2023-10-01 MED ORDER — MIDAZOLAM HCL 2 MG/2ML IJ SOLN
INTRAMUSCULAR | Status: AC
Start: 2023-10-01 — End: ?
  Filled 2023-10-01: qty 2

## 2023-10-01 MED ORDER — ONDANSETRON HCL 4 MG/2ML IJ SOLN
INTRAMUSCULAR | Status: AC
  Filled 2023-10-01: qty 2

## 2023-10-01 MED ORDER — CEFAZOLIN SODIUM-DEXTROSE 2-4 GM/100ML-% IV SOLN
INTRAVENOUS | Status: AC
  Filled 2023-10-01: qty 100

## 2023-10-01 MED ORDER — FENTANYL CITRATE (PF) 100 MCG/2ML IJ SOLN
INTRAMUSCULAR | Status: DC | PRN
  Administered 2023-10-01: 100 ug via INTRAVENOUS

## 2023-10-01 MED ORDER — ACETAMINOPHEN 10 MG/ML IV SOLN
INTRAVENOUS | Status: AC
  Filled 2023-10-01: qty 100

## 2023-10-01 MED ORDER — 0.9 % SODIUM CHLORIDE (POUR BTL) OPTIME
TOPICAL | Status: DC | PRN
  Administered 2023-10-01: 500 mL

## 2023-10-01 MED ORDER — PROPOFOL 500 MG/50ML IV EMUL
INTRAVENOUS | Status: DC | PRN
  Administered 2023-10-01: 100 ug/kg/min via INTRAVENOUS
  Administered 2023-10-01: 30 mg via INTRAVENOUS

## 2023-10-01 MED ORDER — SURGIPHOR WOUND IRRIGATION SYSTEM - OPTIME: TOPICAL | Status: DC | PRN

## 2023-10-01 MED ORDER — ACETAMINOPHEN 500 MG PO TABS
1000.0000 mg | ORAL_TABLET | Freq: Three times a day (TID) | ORAL | Status: DC
  Administered 2023-10-01 – 2023-10-02 (×3): 1000 mg via ORAL
  Filled 2023-10-01 (×3): qty 2

## 2023-10-01 MED ORDER — SODIUM CHLORIDE 0.9 % IV SOLN: INTRAVENOUS | Status: DC

## 2023-10-01 MED ORDER — DOCUSATE SODIUM 100 MG PO CAPS
100.0000 mg | ORAL_CAPSULE | Freq: Two times a day (BID) | ORAL | Status: DC
  Administered 2023-10-01 – 2023-10-02 (×2): 100 mg via ORAL
  Filled 2023-10-01 (×2): qty 1

## 2023-10-01 MED ORDER — ACETAMINOPHEN 10 MG/ML IV SOLN
INTRAVENOUS | Status: DC | PRN
  Administered 2023-10-01: 1000 mg via INTRAVENOUS

## 2023-10-01 MED ORDER — PHENYLEPHRINE 80 MCG/ML (10ML) SYRINGE FOR IV PUSH (FOR BLOOD PRESSURE SUPPORT)
PREFILLED_SYRINGE | INTRAVENOUS | Status: DC | PRN
  Administered 2023-10-01: 160 ug via INTRAVENOUS
  Administered 2023-10-01 (×3): 80 ug via INTRAVENOUS

## 2023-10-01 MED ORDER — METHOCARBAMOL 500 MG PO TABS
500.0000 mg | ORAL_TABLET | Freq: Four times a day (QID) | ORAL | Status: DC | PRN
  Administered 2023-10-01 – 2023-10-02 (×3): 500 mg via ORAL
  Filled 2023-10-01 (×3): qty 1

## 2023-10-01 MED ORDER — PROPOFOL 1000 MG/100ML IV EMUL
INTRAVENOUS | Status: AC
  Filled 2023-10-01: qty 100

## 2023-10-01 MED ORDER — BUPRENORPHINE HCL-NALOXONE HCL 8-2 MG SL SUBL
1.0000 | SUBLINGUAL_TABLET | Freq: Three times a day (TID) | SUBLINGUAL | Status: DC
  Administered 2023-10-01 – 2023-10-02 (×3): 1 via SUBLINGUAL
  Filled 2023-10-01 (×3): qty 1

## 2023-10-01 MED ORDER — TRANEXAMIC ACID-NACL 1000-0.7 MG/100ML-% IV SOLN
INTRAVENOUS | Status: AC
  Filled 2023-10-01: qty 100

## 2023-10-01 MED ORDER — OXYCODONE HCL 5 MG/5ML PO SOLN: 5.0000 mg | Freq: Once | ORAL | Status: DC | PRN

## 2023-10-01 MED ORDER — KETOROLAC TROMETHAMINE 30 MG/ML IJ SOLN
INTRAMUSCULAR | Status: DC | PRN
Start: 2023-10-01 — End: 2023-10-01
  Administered 2023-10-01: 30 mg via INTRAVENOUS

## 2023-10-01 MED ORDER — METHOCARBAMOL 1000 MG/10ML IJ SOLN: 500.0000 mg | Freq: Four times a day (QID) | INTRAMUSCULAR | Status: DC | PRN

## 2023-10-01 MED ORDER — CHLORHEXIDINE GLUCONATE 0.12 % MT SOLN
OROMUCOSAL | Status: AC
  Filled 2023-10-01: qty 15

## 2023-10-01 MED ORDER — MIDAZOLAM HCL 5 MG/5ML IJ SOLN
INTRAMUSCULAR | Status: DC | PRN
  Administered 2023-10-01 (×3): 2 mg via INTRAVENOUS

## 2023-10-01 MED ORDER — MORPHINE SULFATE (PF) 2 MG/ML IV SOLN: 0.5000 mg | INTRAVENOUS | Status: DC | PRN

## 2023-10-01 MED ORDER — KETOROLAC TROMETHAMINE 30 MG/ML IJ SOLN
INTRAMUSCULAR | Status: AC
  Filled 2023-10-01: qty 1

## 2023-10-01 MED ORDER — TRAMADOL HCL 50 MG PO TABS
50.0000 mg | ORAL_TABLET | Freq: Four times a day (QID) | ORAL | Status: DC | PRN
  Administered 2023-10-01: 50 mg via ORAL
  Filled 2023-10-01 (×2): qty 1

## 2023-10-01 MED ORDER — DEXAMETHASONE SODIUM PHOSPHATE 10 MG/ML IJ SOLN
INTRAMUSCULAR | Status: AC
  Filled 2023-10-01: qty 1

## 2023-10-01 MED ORDER — SODIUM CHLORIDE (PF) 0.9 % IJ SOLN
INTRAMUSCULAR | Status: DC | PRN
  Administered 2023-10-01: 50 mL

## 2023-10-01 MED ORDER — HYDROMORPHONE HCL 1 MG/ML IJ SOLN: 0.5000 mg | INTRAMUSCULAR | Status: DC | PRN

## 2023-10-01 MED ORDER — PANTOPRAZOLE SODIUM 40 MG PO TBEC
40.0000 mg | DELAYED_RELEASE_TABLET | Freq: Every day | ORAL | Status: DC
  Administered 2023-10-01 – 2023-10-02 (×2): 40 mg via ORAL
  Filled 2023-10-01 (×2): qty 1

## 2023-10-01 MED ORDER — DEXMEDETOMIDINE HCL IN NACL 80 MCG/20ML IV SOLN
INTRAVENOUS | Status: DC | PRN
  Administered 2023-10-01 (×2): 4 ug via INTRAVENOUS

## 2023-10-01 MED ORDER — FENTANYL CITRATE (PF) 100 MCG/2ML IJ SOLN: 25.0000 ug | INTRAMUSCULAR | Status: DC | PRN

## 2023-10-01 MED ORDER — PHENOL 1.4 % MT LIQD: 1.0000 | OROMUCOSAL | Status: DC | PRN

## 2023-10-01 MED ORDER — ONDANSETRON HCL 4 MG PO TABS: 4.0000 mg | ORAL_TABLET | Freq: Four times a day (QID) | ORAL | Status: DC | PRN

## 2023-10-01 MED ORDER — CEFAZOLIN SODIUM-DEXTROSE 2-4 GM/100ML-% IV SOLN
2.0000 g | Freq: Four times a day (QID) | INTRAVENOUS | Status: AC
  Administered 2023-10-01 (×2): 2 g via INTRAVENOUS
  Filled 2023-10-01 (×2): qty 100

## 2023-10-01 MED ORDER — BUPIVACAINE LIPOSOME 1.3 % IJ SUSP
INTRAMUSCULAR | Status: AC
  Filled 2023-10-01: qty 20

## 2023-10-01 MED ORDER — BUPIVACAINE-EPINEPHRINE (PF) 0.25% -1:200000 IJ SOLN
INTRAMUSCULAR | Status: AC
  Filled 2023-10-01: qty 30

## 2023-10-01 MED ORDER — SODIUM CHLORIDE (PF) 0.9 % IJ SOLN
INTRAMUSCULAR | Status: AC
  Filled 2023-10-01: qty 10

## 2023-10-01 MED ORDER — SODIUM CHLORIDE 0.9 % IR SOLN
Status: DC | PRN
  Administered 2023-10-01: 250 mL

## 2023-10-01 MED ORDER — EPHEDRINE 5 MG/ML INJ
INTRAVENOUS | Status: AC
  Filled 2023-10-01: qty 5

## 2023-10-01 MED ORDER — PHENYLEPHRINE HCL-NACL 20-0.9 MG/250ML-% IV SOLN
INTRAVENOUS | Status: AC
  Filled 2023-10-01: qty 250

## 2023-10-01 MED ORDER — FENTANYL CITRATE (PF) 100 MCG/2ML IJ SOLN
INTRAMUSCULAR | Status: AC
  Filled 2023-10-01: qty 2

## 2023-10-01 MED ORDER — MENTHOL 3 MG MT LOZG: 1.0000 | LOZENGE | OROMUCOSAL | Status: DC | PRN

## 2023-10-01 MED ORDER — PHENYLEPHRINE 80 MCG/ML (10ML) SYRINGE FOR IV PUSH (FOR BLOOD PRESSURE SUPPORT)
PREFILLED_SYRINGE | INTRAVENOUS | Status: AC
  Filled 2023-10-01: qty 10

## 2023-10-01 MED ORDER — KETOROLAC TROMETHAMINE 15 MG/ML IJ SOLN
15.0000 mg | Freq: Four times a day (QID) | INTRAMUSCULAR | Status: DC
  Administered 2023-10-01 – 2023-10-02 (×3): 15 mg via INTRAVENOUS
  Filled 2023-10-01 (×3): qty 1

## 2023-10-01 MED ORDER — METOCLOPRAMIDE HCL 10 MG PO TABS: 5.0000 mg | ORAL_TABLET | Freq: Three times a day (TID) | ORAL | Status: DC | PRN

## 2023-10-01 MED ORDER — ONDANSETRON HCL 4 MG/2ML IJ SOLN
INTRAMUSCULAR | Status: DC | PRN
  Administered 2023-10-01: 4 mg via INTRAVENOUS

## 2023-10-01 MED ORDER — ONDANSETRON HCL 4 MG/2ML IJ SOLN: 4.0000 mg | Freq: Four times a day (QID) | INTRAMUSCULAR | Status: DC | PRN

## 2023-10-01 MED ORDER — BUPIVACAINE HCL (PF) 0.5 % IJ SOLN
INTRAMUSCULAR | Status: DC | PRN
Start: 2023-10-01 — End: 2023-10-01
  Administered 2023-10-01: 2 mL

## 2023-10-01 MED ORDER — GABAPENTIN 100 MG PO CAPS
100.0000 mg | ORAL_CAPSULE | Freq: Two times a day (BID) | ORAL | Status: DC
  Administered 2023-10-01 – 2023-10-02 (×3): 100 mg via ORAL
  Filled 2023-10-01 (×3): qty 1

## 2023-10-01 MED ORDER — ENOXAPARIN SODIUM 40 MG/0.4ML IJ SOSY
40.0000 mg | PREFILLED_SYRINGE | INTRAMUSCULAR | Status: DC
  Administered 2023-10-02: 40 mg via SUBCUTANEOUS
  Filled 2023-10-01: qty 0.4

## 2023-10-01 MED ORDER — OXYCODONE HCL 5 MG PO TABS: 5.0000 mg | ORAL_TABLET | Freq: Once | ORAL | Status: DC | PRN

## 2023-10-01 SURGICAL SUPPLY — 62 items
BIT DRILL TRIDENT 4X40 SU (BIT) IMPLANT
BLADE CLIPPER SURG (BLADE) IMPLANT
BLADE SAGITTAL AGGR TOOTH XLG (BLADE) ×1 IMPLANT
BNDG COHESIVE 6X5 TAN ST LF (GAUZE/BANDAGES/DRESSINGS) ×2 IMPLANT
BRUSH SCRUB EZ PLAIN DRY (MISCELLANEOUS) ×1 IMPLANT
CHLORAPREP W/TINT 26 (MISCELLANEOUS) ×1 IMPLANT
DERMABOND ADVANCED .7 DNX12 (GAUZE/BANDAGES/DRESSINGS) ×1 IMPLANT
DRAPE C-ARM XRAY 36X54 (DRAPES) ×1 IMPLANT
DRAPE SHEET LG 3/4 BI-LAMINATE (DRAPES) ×3 IMPLANT
DRAPE TABLE BACK 80X90 (DRAPES) ×1 IMPLANT
DRSG MEPILEX SACRM 8.7X9.8 (GAUZE/BANDAGES/DRESSINGS) ×1 IMPLANT
DRSG OPSITE POSTOP 4X8 (GAUZE/BANDAGES/DRESSINGS) ×1 IMPLANT
ELECTRODE BLDE 4.0 EZ CLN MEGD (MISCELLANEOUS) ×1 IMPLANT
ELECTRODE REM PT RTRN 9FT ADLT (ELECTROSURGICAL) ×1 IMPLANT
GLOVE BIO SURGEON STRL SZ8 (GLOVE) ×1 IMPLANT
GLOVE BIOGEL PI IND STRL 8 (GLOVE) ×1 IMPLANT
GLOVE PI ORTHO PRO STRL 7.5 (GLOVE) ×2 IMPLANT
GLOVE PI ORTHO PRO STRL SZ8 (GLOVE) ×2 IMPLANT
GLOVE SURG SYN 7.5 E (GLOVE) ×1 IMPLANT
GLOVE SURG SYN 7.5 PF PI (GLOVE) ×1 IMPLANT
GOWN SRG XL LVL 3 NONREINFORCE (GOWNS) ×1 IMPLANT
GOWN STRL REUS W/ TWL LRG LVL3 (GOWN DISPOSABLE) ×1 IMPLANT
GOWN STRL REUS W/ TWL XL LVL3 (GOWN DISPOSABLE) ×1 IMPLANT
HEAD BIOLOX HIP 36/-5 (Joint) IMPLANT
HOOD PEEL AWAY T7 (MISCELLANEOUS) ×2 IMPLANT
INSERT 0 DEGREE 36 (Miscellaneous) IMPLANT
IV NS 100ML SINGLE PACK (IV SOLUTION) ×1 IMPLANT
KIT PATIENT CARE HANA TABLE (KITS) ×1 IMPLANT
KIT TURNOVER CYSTO (KITS) ×1 IMPLANT
LIGHT WAVEGUIDE WIDE FLAT (MISCELLANEOUS) ×1 IMPLANT
MANIFOLD NEPTUNE II (INSTRUMENTS) ×1 IMPLANT
MARKER SKIN DUAL TIP RULER LAB (MISCELLANEOUS) ×1 IMPLANT
MAT ABSORB FLUID 56X50 GRAY (MISCELLANEOUS) ×1 IMPLANT
NDL SPNL 20GX3.5 QUINCKE YW (NEEDLE) ×1 IMPLANT
NEEDLE SPNL 20GX3.5 QUINCKE YW (NEEDLE) ×1 IMPLANT
NS IRRIG 500ML POUR BTL (IV SOLUTION) ×1 IMPLANT
PACK HIP COMPR (MISCELLANEOUS) ×1 IMPLANT
PAD ARMBOARD POSITIONER FOAM (MISCELLANEOUS) ×1 IMPLANT
PENCIL SMOKE EVACUATOR (MISCELLANEOUS) ×1 IMPLANT
SCREW HEX LP 6.5X15 (Screw) IMPLANT
SCREW HEX LP 6.5X20 (Screw) IMPLANT
SCREW HEX LP 6.5X25 (Screw) IMPLANT
SHELL MULTIHOLE ACETAB 48D (Shell) IMPLANT
SLEEVE SCD COMPRESS KNEE MED (STOCKING) ×1 IMPLANT
SOLUTION IRRIG SURGIPHOR (IV SOLUTION) ×1 IMPLANT
STEM STD OFFSET SZ2 32.5 (Stem) IMPLANT
SURGIFLO W/THROMBIN 8M KIT (HEMOSTASIS) IMPLANT
SUT BONE WAX W31G (SUTURE) ×1 IMPLANT
SUT ETHIBOND 2 V 37 (SUTURE) ×1 IMPLANT
SUT SILK 0 30XBRD TIE 6 (SUTURE) ×1 IMPLANT
SUT STRATAFIX 14 PDO 36 VLT (SUTURE) ×1 IMPLANT
SUT STRATAFIX PDO 1 14 VIOLET (SUTURE) ×1 IMPLANT
SUT VIC AB 0 CT1 36 (SUTURE) ×1 IMPLANT
SUT VIC AB 2-0 CT2 27 (SUTURE) ×1 IMPLANT
SUTURE STRATA 1 CT-1 DLB (SUTURE) ×1 IMPLANT
SUTURE STRATA SPIR 4-0 18 (SUTURE) ×1 IMPLANT
SYR 20ML LL LF (SYRINGE) ×2 IMPLANT
TAPE MICROFOAM 4IN (TAPE) IMPLANT
TOWEL OR 17X26 4PK STRL BLUE (TOWEL DISPOSABLE) IMPLANT
TRAP FLUID SMOKE EVACUATOR (MISCELLANEOUS) ×1 IMPLANT
WAND WEREWOLF FASTSEAL 6.0 (MISCELLANEOUS) ×1 IMPLANT
WATER STERILE IRR 1000ML POUR (IV SOLUTION) ×1 IMPLANT

## 2023-10-01 NOTE — Anesthesia Procedure Notes (Signed)
 Spinal  Patient location during procedure: OR Start time: 10/01/2023 10:58 AM End time: 10/01/2023 11:05 AM Reason for block: surgical anesthesia Staffing Performed: anesthesiologist  Anesthesiologist: Enrique Harvest, MD Performed by: Enrique Harvest, MD Authorized by: Enrique Harvest, MD   Preanesthetic Checklist Completed: patient identified, IV checked, site marked, risks and benefits discussed, surgical consent, monitors and equipment checked, pre-op evaluation and timeout performed Spinal Block Patient position: sitting Prep: Betadine Patient monitoring: heart rate, continuous pulse ox, blood pressure and cardiac monitor Approach: midline Location: L3-4 Injection technique: single-shot Needle Needle type: Whitacre and Introducer  Needle gauge: 24 G Needle length: 9 cm Assessment Sensory level: T4 Events: CSF return

## 2023-10-01 NOTE — Progress Notes (Signed)
 The patient requested assistance to void. Despite the RN offering a bedpan due to the patient's limited mobility in the lower extremities following a spinal procedure, the patient was unable to use the bedpan and insisted on getting out of bed to use the bathroom. RN encouraged the patient to remain in bed but, determined to stand, the patient began sliding toward the edge of the bed. RN and NT intervened, securing a bedside commode (BSC) and providing moderate assistance as the patient attempted to stand. However, the patient's legs buckled, prompting RN and NT to lift her onto the [facility]. The patient remarked, "My legs gave out," and was once again reminded of the importance of staying in bed for her safety. She successfully voided in the [facility], and RN and NT safely assisted her back to bed, ensuring a call bell was within reach and a bed alarm was activated.  Cathlyn Coast, RN

## 2023-10-01 NOTE — Interval H&P Note (Signed)
 Patient history and physical updated. Consent reviewed including risks, benefits, and alternatives to surgery. Patient agrees with above plan to proceed with right anterior total hip arthroplasty.

## 2023-10-01 NOTE — Plan of Care (Signed)
  Problem: Activity: Goal: Ability to avoid complications of mobility impairment will improve Outcome: Progressing Goal: Ability to tolerate increased activity will improve Outcome: Progressing   Problem: Pain Management: Goal: Pain level will decrease with appropriate interventions Outcome: Progressing   Problem: Skin Integrity: Goal: Will show signs of wound healing Outcome: Progressing   

## 2023-10-01 NOTE — Transfer of Care (Signed)
 Immediate Anesthesia Transfer of Care Note  Patient: Kristine Griffin  Procedure(s) Performed: ARTHROPLASTY, HIP, TOTAL, ANTERIOR APPROACH (Right: Hip)  Patient Location: PACU  Anesthesia Type:Spinal  Level of Consciousness: awake  Airway & Oxygen Therapy: Patient Spontanous Breathing and Patient connected to face mask oxygen  Post-op Assessment: Report given to RN and Post -op Vital signs reviewed and stable  Post vital signs: Reviewed and stable  Last Vitals:  Vitals Value Taken Time  BP 91/59 10/01/23 1346  Temp    Pulse 63 10/01/23 1348  Resp 18 10/01/23 1348  SpO2 100 % 10/01/23 1348  Vitals shown include unfiled device data.  Last Pain:  Vitals:   10/01/23 0923  TempSrc: Temporal  PainSc: 0-No pain         Complications: No notable events documented.

## 2023-10-01 NOTE — H&P (Signed)
 History of Present Illness: Kristine Griffin is an 33 y.o. female who presents for follow evaluation of her right hip prior to planned right total hip placement. Patient had a new MRI performed this past weekend and reports significant ongoing and worsening pain and sharp popping in her right groin and hip. She is unable to pick up things off the floor or change direction without severe pain. She continues to use Suboxone  as needed for pain and is stayed away from any narcotics. Her daily activities are limited by her right hip and conservative treatments at this point have not yielded results allowing daily function. We discussed hip replacement as a potential surgical option. The patient reports she is no longer planning to have anymore children and has completed breast-feeding. The patient denies fevers, chills, numbness, tingling, shortness of breath, chest pain, recent illness, or any trauma.  Patient is a non-smoker, nondiabetic with a BMI of 27.6  Past Medical History: Past Medical History:  Diagnosis Date  Depression  PTSD (post-traumatic stress disorder)   Past Surgical History: History reviewed. No pertinent surgical history.  Past Family History: Family History  Problem Relation Age of Onset  Thyroid  disease Sister  Thyroid  disease Maternal Aunt   Medications: Current Outpatient Medications  Medication Sig Dispense Refill  SUBOXONE  8-2 mg SL film PLACE 1 STRIP UNDER THE TONGUE IN THE MORNING AND 1 2 (ONE HALF) STRIP UNDER THE TONGUE AT BEDTIME FOR 14 DAYS 0   No current facility-administered medications for this visit.   Allergies: No Known Allergies   Visit Vitals: Vitals:  09/18/23 1000  BP: 116/78    Review of Systems:  A comprehensive 14 point ROS was performed, reviewed, and the pertinent orthopaedic findings are documented in the HPI.  Physical Exam: Body mass index is 27.62 kg/m. General/Constitutional: No apparent distress: well-nourished and well  developed. Lymphatic: No palpable adenopathy. Pulmonary exam: Lungs clear to auscultation bilaterally no wheezing rales or rhonchi Cardiac exam: Regular rate and rhythm no obvious murmurs rubs or gallops. Vascular: No edema, swelling or tenderness, except as noted in detailed exam. Integumentary: No impressive skin lesions present, except as noted in detailed exam. Neuro/Psych: Normal mood and affect, oriented to person, place and time. Musculoskeletal: Normal, except as noted in detailed exam and in HPI.  Right hip exam  SKIN: intact SWELLING: none WARMTH: no warmth TENDERNESS: none, Stinchfield Positive ROM: 0 degrees internal rotation and 40 degrees external rotation and pain with internal rotation,; Hip Flexion 80 STRENGTH: limited by pain GAIT: antalgic favoring the right STABILITY: stable to testing CREPITUS: yes LEG LENGTH DISCREPANCY: left longer by 1 cm NEUROLOGICAL EXAM: normal VASCULAR EXAM: normal LUMBAR SPINE: tenderness: yes paraspinal on the right no bony step-off straight leg raising sign: no motor exam: normal  The contralateral hip was examined for comparison and it showed: TENDERNESS: none ROM: normal and full STRENGTH: normal STABILITY: stable to testing  Hip Imaging :  I have reviewed AP pelvis, lateral pelvis, and lateral hip X-rays (4 views) taken today in the office of the right hip which reveal moderate to severe degenerative changes with decreased center edge angle and lateralization of the hip center consistent with dysplasia of the right hip with almost 50% femoral head uncoverage. Tonnis angle of approx 19 degrees on right. Lateral of the pelvis does show good superior bony coverage medially with joint space narrowing sclerosis and osteophyte formation as well as calcification over the lateral femoral head consistent with dysplastic degeneration of the right hip. Left  hip shows better femoral head coverage with some mild degenerative changes and  sclerosis. No fractures or dislocations noted in either hip or the pelvis.  MRI of the right hip without contrast performed on 09/16/2023 images reviewed report not yet available. There are severe degenerative changes of the right hip with lateralization of the hip center with complete cartilage loss along the top of the femoral head with sclerosis osteophyte formation and femoral deformity. There is bony remodeling of the acetabulum with likely labral tearing and shallow cup angle. Consistent with hip dysplasia and subsequent severe degenerative changes.  Assessment:  No diagnosis found.  Plan: Kristine Griffin is a 35Y female who presents with congenital dysplasia of the right hip with subsequent severe degenerative changes with loss of joint space osteophyte formation and subluxation of the hip. We discussed treatment options including conservative treatments and surgical treatments including the possibility of referral for salvage osteotomy of the hip. The new MRI shows significantly worsening changes from her prior MRI 5 years ago. I do think that surgical intervention with hip replacement is the most reliable way of fixing her problem and we did discuss the risks and benefits of proceeding such as surgery at a young age. We did discuss there is a high risk that she would need another surgery during her life for her hip if we to proceed with hip replacement. based upon the patient's continued symptoms and failure to respond to conservative treatment, I have recommended a right total hip replacement for this patient. A long discussion took place with the patient describing what a total joint replacement is and what the procedure would entail. A hip model, similar to the implants that will be used during the operation, was utilized to demonstrate the implants. Choices of implant manufactures were discussed and reviewed. The ability to secure the implant utilizing cement or cementless (press fit) fixation was  discussed. Anterior and posterior exposures were discussed. For this patient an appropriate approach will be anterior.   The hospitalization and post-operative care and rehabilitation were also discussed. The use of perioperative antibiotics and DVT prophylaxis were discussed. The risk, benefits and alternatives to a surgical intervention were discussed at length with the patient. The patient was also advised of risks related to the medical comorbidities and elevated body mass index (BMI). A lengthy discussion took place to review the most common complications including but not limited to: deep vein thrombosis, pulmonary embolus, heart attack, stroke, infection, wound breakdown, heterotopic ossification, dislocation, numbness, leg length in-equality, intraoperative fracture, damage to nerves, tendon,muscles, arteries or other blood vessels, death and other possible complications from anesthesia. The patient was told that we will take steps to minimize these risks by using sterile technique, antibiotics and DVT prophylaxis when appropriate and follow the patient postoperatively in the office setting to monitor progress. The possibility of recurrent pain, no improvement in pain and actual worsening of pain were also discussed with the patient. The risk of dislocation following total hip replacement was discussed and potential precautions to prevent dislocation were reviewed.   Patient asked about and confirms no history of any reactions to metal or metal allergy in the past.  The discharge plan of care focused on the patient going home following surgery. The patient was encouraged to make the necessary arrangements to have someone stay with them when they are discharged home.   The benefits of surgery were discussed with the patient including the potential for improving the patient's current clinical condition through operative intervention. Alternatives to  surgical intervention including continued  conservative management were also discussed in detail. All questions were answered to the satisfaction of the patient. The patient participated and agreed to the plan of care as well as the use of the recommended implants for their total hip replacement surgery. An information packet was given to the patient to review prior to surgery.   Patient received medical clearance for surgery. MRI was performed which showed redemonstration of severe degenerative changes with dysplasia. Bone stock appears good for fixation. All questions answered agrees with plan appropriations for a right anterior total hip replacement.   We reviewed plan to avoid narcotic pain medications if possible with a plan for spinal anesthesia and multi-modal pain control. Patient is ok with narcotics and pain management doctor advised should medications be needed for short term use post operatively that would be ok.    Portions of this record have been created using Scientist, clinical (histocompatibility and immunogenetics). Dictation errors have been sought, but may not have been identified and corrected.  Kristine Ginsberg MD

## 2023-10-01 NOTE — Progress Notes (Signed)
 Patient returned from bathroom with vape pen in hand prior to being transported to OR. Notified Dr. Duayne Gey and Dr. Clyda Dark.

## 2023-10-01 NOTE — Op Note (Signed)
 Patient Name: Kristine Griffin  NWG:[id number]  Pre-Operative Diagnosis: Right hip dysplasia with severe degenerative changes  Post-Operative Diagnosis: (same)  Procedure: Right Total Hip Arthroplasty  Components/Implants: Cup: Trident Tritanium Multi Hole 48/D w/x3 screws    Liner: Neutral X3 Poly 36/D  Stem: Insignia #2 Std Offset  Head: Biolox ceramic 36-69mm   Date of Surgery: 10/01/2023  Surgeon: Venus Ginsberg MD  Assistant: Francenia Ingle PA (present and scrubbed throughout the case, critical for assistance with exposure, retraction, instrumentation, and closure)   Anesthesiologist: Duayne Gey  Anesthesia: Spinal   EBL: 150cc  IVF:1000cc  Complications: None   Brief history: The patient is a 33 year old female with a history of osteoarthritis of the right hip with pain limiting their range of motion and activities of daily living, which has failed multiple attempts at conservative therapy.  The risks and benefits of total hip arthroplasty as definitive surgical treatment were discussed with the patient, who opted to proceed with the operation.  After outpatient medical clearance and optimization was completed the patient was admitted to [facility] for the procedure.  All preoperative films were reviewed and an appropriate surgical plan was made prior to surgery.   Description of procedure: The patient was brought to the operating room where laterality was confirmed by all those present to be the right side.  The patient was administered spinal anesthesia on a stretcher prior to being moved supine on the operating room table. Patient was given an intravenous dose of antibiotics for surgical prophylaxis and TXA.  All bony prominences and extremities were well padded and the patient was securely attached to the table boots, a perineal post was placed and the patient had a safety strap placed.  Surgical site was prepped with alcohol and chlorhexidine. The surgical  site over the hip was and draped in typical sterile fashion with multiple layers of adhesive and nonadhesive drapes.  The incision site was marked out with a sterile marker and care was taken to assess the position of the ASIS and ensure appropriate position for the incision.    A surgical timeout was then called with participation of all staff in the room the patient was then a confirmed again and laterality confirmed.  Incision was made over the anterior lateral aspect of the proximal thigh in line with the TFL.  Appropriate retractors were placed and all bleeding vessels were coagulated within the subcutaneous and fatty layers.  An incision was made in the TFL fascia in the interval was carefully identified.  The lateral ascending branches of the circumflex vessels were identified, cauterized and carefully dissected. The main vessels were then tied with a 0 silk hand tie.  Retractors were placed around the superior lateral and inferior medial aspects of the femoral neck and a capsulotomy was performed exposing the hip joint.  Retraction stitches were placed and the capsulotomy to assist with visualization.  Femoral neck cut was then made and the femoral head was extracted after placing the leg in traction.  Bone wax was then applied to the proximal cut surface of the femur and aqua mantis was used to address any bleeding around the femoral neck cut.  Retractors were then placed around the acetabulum to fully visualize the joint space, and the remaining labral tissue was removed and pulvinar was removed.   The acetabulum was then sequentially reamed up to the appropriate size in order to get good fit and fill for the acetabular component while under fluoroscopic guidance.  Acetabular component was  then placed and malleted into a secure fit while confirming position and abduction angle and anteversion utilizing fluoroscopy.  2 screws were then placed in the acetabular cup to assist in securing the cup in  place. The cup was irrigated,  a real neutral liner was placed, impacted, and checked for stability. The femur traction was dropped and sequentially externally rotated while performing a release of the posterior and superomedial tissues off of the proximal femur to allow for mobility, care was taken to preserve the external rotators and piriformis attachments.  The remaining interval between the abductors and the capsule was dissected out and a retractor was placed over the superolateral aspect of the femur over the greater trochanter.  The leg was carefully brought down into extension and adducted to provide visualization of the proximal femur for broaching.  The femur was then sequentially broached up to an appropriate size which provided for good fill and stability to the femoral broach.  A trial neck and head were placed on the femoral broach and the leg was brought up for reduction.  The hip was reduced and manual check of stability was performed.  The hip was found to be stable in flexion internal rotation and extension external rotation.  Leg lengths were confirmed on fluoroscopy.   The hip was then dislocated the trial neck and head were removed.  The leg was then brought down into extension and adduction in the proximal femur was reexposed.  The broach trial was removed and the femur was irrigated with normal saline prior to the real femoral stem being implanted.  After the femoral stem was seated and shown to have good fit and fill the appropriate head was impacted the leg was brought up and reduced.  There was good range of motion with stability in flexion internal rotation and extension external rotation on testing.  Leg lengths were found to be appropriate on fluoroscopic evaluation at this time.  The hip was then irrigated with betdine based surgiphor solution and then saline solution.  The capsulotomy was repaired with Ethibond sutures.  A pericapsular and peritrochanteric cocktail with Exparel and  bupivacaine was then injected as well as the subcutaneous tissues. The fascia was closed with a #1 barbed running suture.  The deep tissues were closed with Vicryl sutures the subcutaneous tissues were closed with interrupted Vicryl sutures and a running barbed 4-0 suture.  The skin was then reinforced with Dermabond and a sterile dressing was placed.   The patient was awoken from anesthesia transferred off of the operating room table onto a hospital bed where examination of leg lengths found the leg lengths to be equal with a good distal pulse.  The patient was then transferred to the PACU in stable condition.

## 2023-10-01 NOTE — Progress Notes (Signed)
 PT Cancellation Note  Patient Details Name: CADI SEAHORN MRN: [id number] DOB: 19-Sep-1990   Cancelled Treatment:    Reason Eval/Treat Not Completed: Other (comment). Consult received. Pt still with decreased sensation and unable to move B LEs at this time. Will re-attempt next date.   Oden Lindaman 10/01/2023, 3:42 PM Amparo Balk, PT, DPT, GCS [phone]

## 2023-10-01 NOTE — TOC Progression Note (Signed)
 Transition of Care [facility]) - Progression Note    Patient Details  Name: Kristine Griffin MRN: [id number] Date of Birth: Nov 08, 1990  Transition of Care [employer]) CM/SW Contact  Alexandra Ice, RN Phone Number: 10/01/2023, 3:41 PM  Clinical Narrative:     Received message from bedside nurse patient needs RW, sent order to Jon with [employer] for processing.       Expected Discharge Plan and Services                                               Social Determinants of Health (SDOH) Interventions SDOH Screenings   Food Insecurity: No Food Insecurity (10/01/2023)  Housing: High Risk (08/16/2023)   Received from [facility]  Transportation Needs: No Transportation Needs (08/16/2023)   Received from [facility]  Utilities: Not At Risk (08/16/2023)   Received from [facility]  Alcohol Screen: Low Risk  (09/05/2018)  Depression (PHQ2-9): Low Risk  (11/22/2021)  Financial Resource Strain: Low Risk  (08/16/2023)   Received from [facility]  Physical Activity: Inactive (12/13/2022)   Received from [facility]  Tobacco Use: Medium Risk (10/01/2023)    Readmission Risk Interventions     No data to display

## 2023-10-01 NOTE — Anesthesia Preprocedure Evaluation (Signed)
 Anesthesia Evaluation  Patient identified by MRN, date of birth, ID band Patient awake    Reviewed: Allergy & Precautions, NPO status , Patient's Chart, lab work & pertinent test results  Airway Mallampati: III  TM Distance: >3 FB Neck ROM: full    Dental  (+) Chipped   Pulmonary neg pulmonary ROS   Pulmonary exam normal        Cardiovascular negative cardio ROS Normal cardiovascular exam     Neuro/Psych  PSYCHIATRIC DISORDERS Anxiety Depression    negative neurological ROS     GI/Hepatic negative GI ROS, Neg liver ROS,,,  Endo/Other  negative endocrine ROS    Renal/GU      Musculoskeletal   Abdominal   Peds  Hematology negative hematology ROS (+)   Anesthesia Other Findings Patient very anxious about a spinal but does not want post op pain. Patient is on suboxone  and was worried about her post op pain. Explained to the patient that with general anesthesia, she would experience more immediate post op pain. After a discussion with Dr. Clyda Dark and her family member, she has decided to proceed with a spinal with back up GA ETT. Patient consented for opioids after surgery and understands that it may take more opioids to treat her pain.    Past Medical History: No date: BV (bacterial vaginosis) No date: Congenital dysplasia of right hip No date: Diabetes mellitus without complication (HCC) No date: Hip dislocation, right (HCC)     Comment:  x2 No date: Migraine without aura No date: Narcotic dependence (HCC)     Comment:  currently on suboxone  due to HX of narcotic dependency-               per chart review  History reviewed. No pertinent surgical history.  BMI    Body Mass Index: 26.89 kg/m      Reproductive/Obstetrics negative OB ROS                             Anesthesia Physical Anesthesia Plan  ASA: 2  Anesthesia Plan: Spinal   Post-op Pain Management:    Induction:    PONV Risk Score and Plan:   Airway Management Planned: Natural Airway and Nasal Cannula  Additional Equipment:   Intra-op Plan:   Post-operative Plan:   Informed Consent: I have reviewed the patients History and Physical, chart, labs and discussed the procedure including the risks, benefits and alternatives for the proposed anesthesia with the patient or authorized representative who has indicated his/her understanding and acceptance.     Dental Advisory Given  Plan Discussed with: Anesthesiologist, CRNA and Surgeon  Anesthesia Plan Comments: (Patient reports no bleeding problems and no anticoagulant use.  Plan for spinal with backup GA  Patient consented for risks of anesthesia including but not limited to:  - adverse reactions to medications - damage to eyes, teeth, lips or other oral mucosa - nerve damage due to positioning  - risk of bleeding, infection and or nerve damage from spinal that could lead to paralysis - risk of headache or failed spinal - damage to teeth, lips or other oral mucosa - sore throat or hoarseness - damage to heart, brain, nerves, lungs, other parts of body or loss of life  Patient voiced understanding and assent.)       Anesthesia Quick Evaluation

## 2023-10-02 ENCOUNTER — Encounter: Payer: Self-pay | Admitting: Orthopedic Surgery

## 2023-10-02 ENCOUNTER — Telehealth: Payer: Self-pay | Admitting: Orthopedic Surgery

## 2023-10-02 DIAGNOSIS — M1611 Unilateral primary osteoarthritis, right hip: Secondary | ICD-10-CM | POA: Diagnosis not present

## 2023-10-02 LAB — CBC
HCT: 31.8 % — ABNORMAL LOW (ref 36.0–46.0)
Hemoglobin: 10.9 g/dL — ABNORMAL LOW (ref 12.0–15.0)
MCH: 28.9 pg (ref 26.0–34.0)
MCHC: 34.3 g/dL (ref 30.0–36.0)
MCV: 84.4 fL (ref 80.0–100.0)
Platelets: 270 10*3/uL (ref 150–400)
RBC: 3.77 MIL/uL — ABNORMAL LOW (ref 3.87–5.11)
RDW: 12.2 % (ref 11.5–15.5)
WBC: 18.7 10*3/uL — ABNORMAL HIGH (ref 4.0–10.5)
nRBC: 0 % (ref 0.0–0.2)

## 2023-10-02 LAB — BASIC METABOLIC PANEL WITH GFR
Anion gap: 8 (ref 5–15)
BUN: 10 mg/dL (ref 6–20)
CO2: 22 mmol/L (ref 22–32)
Calcium: 8.7 mg/dL — ABNORMAL LOW (ref 8.9–10.3)
Chloride: 107 mmol/L (ref 98–111)
Creatinine, Ser: 0.57 mg/dL (ref 0.44–1.00)
GFR, Estimated: >60 mL/min (ref >60)
Glucose, Bld: 169 mg/dL — ABNORMAL HIGH (ref 70–99)
Potassium: 4 mmol/L (ref 3.5–5.1)
Sodium: 137 mmol/L (ref 135–145)

## 2023-10-02 MED ORDER — ACETAMINOPHEN 500 MG PO TABS
1000.0000 mg | ORAL_TABLET | Freq: Three times a day (TID) | ORAL | 0 refills | Status: AC
Start: 2023-10-02 — End: ?

## 2023-10-02 MED ORDER — ENOXAPARIN SODIUM 40 MG/0.4ML IJ SOSY: 40.0000 mg | PREFILLED_SYRINGE | INTRAMUSCULAR | 0 refills | Status: AC

## 2023-10-02 MED ORDER — DOCUSATE SODIUM 100 MG PO CAPS: 100.0000 mg | ORAL_CAPSULE | Freq: Two times a day (BID) | ORAL | 0 refills | Status: AC

## 2023-10-02 MED ORDER — METHOCARBAMOL 500 MG PO TABS: 500.0000 mg | ORAL_TABLET | Freq: Four times a day (QID) | ORAL | 0 refills | Status: AC | PRN

## 2023-10-02 MED ORDER — TRAMADOL HCL 50 MG PO TABS: 50.0000 mg | ORAL_TABLET | Freq: Four times a day (QID) | ORAL | 0 refills | Status: DC | PRN

## 2023-10-02 MED ORDER — TRAMADOL HCL 50 MG PO TABS
50.0000 mg | ORAL_TABLET | Freq: Four times a day (QID) | ORAL | 0 refills | Status: DC | PRN
Start: 2023-10-02 — End: 2023-10-02

## 2023-10-02 MED ORDER — GABAPENTIN 100 MG PO CAPS: 100.0000 mg | ORAL_CAPSULE | Freq: Two times a day (BID) | ORAL | 0 refills | Status: AC

## 2023-10-02 MED ORDER — CELECOXIB 200 MG PO CAPS: 200.0000 mg | ORAL_CAPSULE | Freq: Two times a day (BID) | ORAL | 0 refills | Status: AC

## 2023-10-02 MED ORDER — TRAMADOL HCL 50 MG PO TABS
50.0000 mg | ORAL_TABLET | Freq: Four times a day (QID) | ORAL | Status: DC | PRN
Start: 2023-10-02 — End: 2023-10-02
  Administered 2023-10-02: 100 mg via ORAL
  Filled 2023-10-02: qty 2

## 2023-10-02 MED ORDER — ONDANSETRON HCL 4 MG PO TABS: 4.0000 mg | ORAL_TABLET | Freq: Four times a day (QID) | ORAL | 0 refills | Status: AC | PRN

## 2023-10-02 NOTE — Progress Notes (Signed)
 Pt anxious asking for pain meds; meds given. Pt spoke with sister and wanting  med to sedate her while sleeping; charge RN reached out to hospitalist on call,  to get ortho tonight. I gave Robaxin for spasm, pt stated it help well with pain. Pt pain with Robaxin relief to a 5; Toradol  given, will continue to monitor.

## 2023-10-02 NOTE — Discharge Summary (Signed)
 Physician Discharge Summary  Patient ID: Kristine Griffin MRN: [id number] DOB/AGE: [birth year] 34 y.o.  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 female who was admitted 10/01/2023 with a diagnosis of S/P total right hip arthroplasty and went to the operating room on 10/01/2023 and underwent the above named procedures.    Surgeries: Procedure(s): ARTHROPLASTY, HIP, TOTAL, ANTERIOR APPROACH on 10/01/2023 Patient tolerated the surgery well. Taken to PACU where she was stabilized and then transferred to the orthopedic floor.  Started on Lovenox  40 mg q 24 hrs. TEDs and SCDs applied bilaterally. Heels elevated on bed. No evidence of DVT. Negative Homan. Physical therapy started on day #1 for gait training and transfer. OT started day #1 for ADL  and assisted devices.  Patient's IV was d/c on day #1. Patient was able to safely and independently complete all PT goals. PT recommending discharge to home.    On post op day #1 patient was stable and ready for discharge to home with home health PT.  Implants: Cup: Trident Tritanium Multi Hole 48/D w/x3 screws    Liner: Neutral X3 Poly 36/D  Stem: Insignia #2 Std Offset  Head: Biolox ceramic 36-5mm     She was given perioperative antibiotics:  Anti-infectives (From admission, onward)    Start     Dose/Rate Route Frequency Ordered Stop   10/01/23 1700  ceFAZolin (ANCEF) IVPB 2g/100 mL premix        2 g 200 mL/hr over 30 Minutes Intravenous Every 6 hours 10/01/23 1521 10/01/23 2348   10/01/23 0600  ceFAZolin (ANCEF) IVPB 2g/100 mL premix        2 g 200 mL/hr over 30 Minutes Intravenous On call to O.R. 09/30/23 2217 10/01/23 1105     .  She was given sequential compression devices, early ambulation, and Lovenox , teds for DVT prophylaxis.  She benefited maximally from the hospital stay and there were no complications.    Recent vital signs:  Vitals:   10/01/23 2005 10/02/23 0431  BP: 107/73 (!) 129/110  Pulse: 86 73  Resp: 19 19  Temp: 98.2 F (36.8 C) 98.4 F (36.9 C)  SpO2: 99% 100%    Recent laboratory studies:  Lab Results  Component Value Date   HGB 10.9 (L) 10/02/2023   HGB 13.4 09/18/2023   HGB 13.9  09/04/2018   Lab Results  Component Value Date   WBC 18.7 (H) 10/02/2023   PLT 270 10/02/2023   No results found for: "INR" Lab Results  Component Value Date   NA 137 10/02/2023   K 4.0 10/02/2023   CL 107 10/02/2023   CO2 22 10/02/2023   BUN 10 10/02/2023   CREATININE 0.57 10/02/2023   GLUCOSE 169 (H) 10/02/2023    Discharge Medications:   Allergies as of 10/02/2023   No Known Allergies      Medication List     TAKE these medications    acetaminophen  500 MG tablet Commonly known as: TYLENOL  Take 2 tablets (1,000 mg total) by mouth every 8  (eight) hours.   BC FAST PAIN RELIEF PO Take 1 packet by mouth daily as needed (pain).   celecoxib  200 MG capsule Commonly known as: CeleBREX  Take 1 capsule (200 mg total) by mouth 2 (two) times daily for 14 days.   docusate sodium  100 MG capsule Commonly known as: COLACE Take 1 capsule (100 mg total) by mouth 2 (two) times daily.   enoxaparin  40 MG/0.4ML injection Commonly known as: LOVENOX  Inject 0.4 mLs (40 mg total) into the skin daily for 14 days.   gabapentin  100 MG capsule Commonly known as: NEURONTIN  Take 1 capsule (100 mg total) by mouth 2 (two) times daily.   methocarbamol 500 MG tablet Commonly known as: ROBAXIN Take 1 tablet (500 mg total) by mouth every 6 (six) hours as needed for muscle spasms.   ondansetron  4 MG tablet Commonly known as: ZOFRAN  Take 1 tablet (4 mg total) by mouth every 6 (six) hours as needed for nausea.   Suboxone  8-2 MG Film Generic drug: Buprenorphine  HCl-Naloxone  HCl Place 0.5-1 Film under the tongue See admin instructions. Take 1 film in the morning, 1 film in the afternoon, and 0.5 film in the evening   traMADol 50 MG tablet Commonly known as: ULTRAM Take 1-2 tablets (50-100 mg total) by mouth every 6 (six) hours as needed for moderate pain (pain score 4-6) (If not controlled with non opiate medications, at patient request).               Durable Medical Equipment  (From admission, onward)           Start     Ordered   10/02/23 0806  For home use only DME Walker  Once       Question:  Patient needs a walker to treat with the following condition  Answer:  History of total hip replacement, right   10/02/23 0805   10/01/23 1538  For home use only DME Walker rolling  Once       Question Answer Comment  Walker: With 5 Inch Wheels   Patient needs a walker to treat with the following condition Acquired dysplasia of right hip      10/01/23 1540            Diagnostic Studies: DG HIP UNILAT WITH PELVIS 2-3 VIEWS  RIGHT Result Date: 10/01/2023 CLINICAL DATA:  Right hip arthroplasty. EXAM: DG HIP (WITH OR WITHOUT PELVIS) 2-3V RIGHT COMPARISON:  Preoperative MRI FINDINGS: Three fluoroscopic spot views of the pelvis and right hip obtained in the operating room. Images during hip arthroplasty. Fluoroscopy time 19 seconds. Dose 2.6 mGy. IMPRESSION: Intraoperative fluoroscopy during right hip arthroplasty. Electronically Signed   By: Chadwick Colonel M.D.   On: 10/01/2023 15:59   DG C-Arm 1-60 Min-No Report Result Date: 10/01/2023 Fluoroscopy was  utilized by the requesting physician.  No radiographic interpretation.   DG C-Arm 1-60 Min-No Report Result Date: 10/01/2023 Fluoroscopy was utilized by the requesting physician.  No radiographic interpretation.    Disposition:      Follow-up Information     Coralyn Derry, PA-C Follow up in 2 week(s).   Specialties: Orthopedic Surgery, Emergency Medicine Contact information: [address] [place] [address] [phone]                  Signed: Coralyn Derry 10/02/2023, 8:09 AM

## 2023-10-02 NOTE — Evaluation (Signed)
 Physical Therapy Evaluation Patient Details Name: Kristine Griffin MRN: [id number] DOB: April 27, 1991 Today's Date: 10/02/2023  History of Present Illness  Pt admitted for R THR- ant approach on 10/01/23; currently is POD 1 at time of evaluation. History includes PTSD and depression.  Clinical Impression  Pt is a pleasant 33 year old female who was admitted for R THR- ant approach. Pt demonstrates all bed mobility/transfers/ambulation with safe technique and use of RW. Education given regarding car transfers/HEP/continued mobility. Pt does not require any further acute PT needs at this time. Pt will be dc in house and does not require follow up. RN aware. Will dc current orders. Pt with plans to work with OP PT in [place].         If plan is discharge home, recommend the following: Help with stairs or ramp for entrance   Can travel by private vehicle        Equipment Recommendations None recommended by PT  Recommendations for Other Services       Functional Status Assessment Patient has had a recent decline in their functional status and demonstrates the ability to make significant improvements in function in a reasonable and predictable amount of time.     Precautions / Restrictions Precautions Precautions: Anterior Hip Precaution Booklet Issued: Yes (comment) Recall of Precautions/Restrictions: Intact Restrictions Weight Bearing Restrictions Per Provider Order: Yes RLE Weight Bearing Per Provider Order: Weight bearing as tolerated      Mobility  Bed Mobility Overal bed mobility: Independent                  Transfers Overall transfer level: Independent Equipment used: Rolling walker (2 wheels)               General transfer comment: safe technique with upright posture. RW adjusted to height    Ambulation/Gait Ambulation/Gait assistance: Modified independent (Device/Increase time) Gait Distance (Feet): 250 Feet Assistive device: Rolling walker (2  wheels) Gait Pattern/deviations: Step-through pattern       General Gait Details: ambulated around hallway with reciprocal gait pattern and safe technique. Cues for upright posture and improved normalized gait pattern  Stairs Stairs: Yes Stairs assistance: Supervision Stair Management: One rail Right, Step to pattern, Forwards Number of Stairs: 4 General stair comments: up/down with safe technique.  Wheelchair Mobility     Tilt Bed    Modified Rankin (Stroke Patients Only)       Balance Overall balance assessment: Independent                                           Pertinent Vitals/Pain Pain Assessment Pain Assessment: Faces Faces Pain Scale: Hurts even more Pain Location: R hip Pain Descriptors / Indicators: Operative site guarding Pain Intervention(s): Limited activity within patient's tolerance, Repositioned, Premedicated before session    Home Living Family/patient expects to be discharged to:: Private residence Living Arrangements: Spouse/significant other Available Help at Discharge: Family Type of Home: House Home Access: Stairs to enter Entrance Stairs-Rails: Doctor, general practice of Steps: 4   Home Layout: One level Home Equipment: None Additional Comments: has 2 month old baby at home, received needed DME at bedside    Prior Function Prior Level of Function : Independent/Modified Independent;Driving             Mobility Comments: indep ADLs Comments: indep     Extremity/Trunk Assessment   Upper Extremity  Assessment Upper Extremity Assessment: Overall WFL for tasks assessed    Lower Extremity Assessment Lower Extremity Assessment: Generalized weakness (R LE grossly 4/5)       Communication   Communication Communication: No apparent difficulties    Cognition Arousal: Alert Behavior During Therapy: WFL for tasks assessed/performed   PT - Cognitive impairments: No apparent impairments                        PT - Cognition Comments: pleasant and agreeable to session Following commands: Intact       Cueing Cueing Techniques: Verbal cues     General Comments      Exercises Other Exercises Other Exercises: reviewed written HEP and performed.   Assessment/Plan    PT Assessment All further PT needs can be met in the next venue of care  PT Problem List Decreased strength;Decreased activity tolerance;Decreased balance;Decreased mobility;Pain;Decreased knowledge of use of DME       PT Treatment Interventions      PT Goals (Current goals can be found in the Care Plan section)  Acute Rehab PT Goals Patient Stated Goal: to go home PT Goal Formulation: With patient Time For Goal Achievement: 10/02/23 Potential to Achieve Goals: Good    Frequency       Co-evaluation               AM-PAC PT "6 Clicks" Mobility  Outcome Measure Help needed turning from your back to your side while in a flat bed without using bedrails?: None Help needed moving from lying on your back to sitting on the side of a flat bed without using bedrails?: None Help needed moving to and from a bed to a chair (including a wheelchair)?: None Help needed standing up from a chair using your arms (e.g., wheelchair or bedside chair)?: None Help needed to walk in hospital room?: None Help needed climbing 3-5 steps with a railing? : None 6 Click Score: 24    End of Session   Activity Tolerance: Patient tolerated treatment well Patient left: in bed Nurse Communication: Mobility status PT Visit Diagnosis: Muscle weakness (generalized) (M62.81);Difficulty in walking, not elsewhere classified (R26.2);Pain Pain - Right/Left: Right Pain - part of body: Hip    Time: 1610-9604 PT Time Calculation (min) (ACUTE ONLY): 33 min   Charges:   PT Evaluation $PT Eval Low Complexity: 1 Low PT Treatments $Gait Training: 8-22 mins PT General Charges $$ ACUTE PT VISIT: 1 Visit         Amparo Balk, PT, DPT, GCS [phone]   Earlean Fidalgo 10/02/2023, 10:36 AM

## 2023-10-02 NOTE — Plan of Care (Signed)
  Problem: Education: Goal: Knowledge of the prescribed therapeutic regimen will improve Outcome: Progressing Goal: Understanding of discharge needs will improve Outcome: Progressing Goal: Individualized Educational Video(s) Outcome: Progressing   Problem: Activity: Goal: Ability to avoid complications of mobility impairment will improve Outcome: Progressing Goal: Ability to tolerate increased activity will improve Outcome: Progressing   Problem: Clinical Measurements: Goal: Postoperative complications will be avoided or minimized Outcome: Progressing   Problem: Pain Management: Goal: Pain level will decrease with appropriate interventions Outcome: Progressing   Problem: Skin Integrity: Goal: Will show signs of wound healing Outcome: Progressing   Problem: Education: Goal: Knowledge of General Education information will improve Description: Including pain rating scale, medication(s)/side effects and non-pharmacologic comfort measures Outcome: Progressing   Problem: Health Behavior/Discharge Planning: Goal: Ability to manage health-related needs will improve Outcome: Progressing   Problem: Clinical Measurements: Goal: Ability to maintain clinical measurements within normal limits will improve Outcome: Progressing Goal: Will remain free from infection Outcome: Progressing Goal: Diagnostic test results will improve Outcome: Progressing Goal: Respiratory complications will improve Outcome: Progressing Goal: Cardiovascular complication will be avoided Outcome: Progressing   Problem: Activity: Goal: Risk for activity intolerance will decrease Outcome: Progressing   Problem: Nutrition: Goal: Adequate nutrition will be maintained Outcome: Progressing   Problem: Coping: Goal: Level of anxiety will decrease Outcome: Progressing   Problem: Elimination: Goal: Will not experience complications related to bowel motility Outcome: Progressing Goal: Will not experience  complications related to urinary retention Outcome: Progressing   Problem: Pain Managment: Goal: General experience of comfort will improve and/or be controlled Outcome: Progressing   Problem: Safety: Goal: Ability to remain free from injury will improve Outcome: Progressing   Problem: Skin Integrity: Goal: Risk for impaired skin integrity will decrease Outcome: Progressing

## 2023-10-02 NOTE — Progress Notes (Signed)
   Subjective: 1 Day Post-Op Procedure(s) (LRB): ARTHROPLASTY, HIP, TOTAL, ANTERIOR APPROACH (Right) Patient reports pain as mild.   Patient is well, and has had no acute complaints or problems Denies any CP, SOB, ABD pain. We will continue therapy today.  Plan is to go Home after hospital stay.  Objective: Vital signs in last 24 hours: Temp:  [98 F (36.7 C)-98.4 F (36.9 C)] 98.4 F (36.9 C) (04/29 0431) Pulse Rate:  [57-95] 73 (04/29 0431) Resp:  [9-21] 19 (04/29 0431) BP: (91-129)/(54-110) 129/110 (04/29 0431) SpO2:  [98 %-100 %] 100 % (04/29 0431) Weight:  [68.9 kg] 68.9 kg (04/28 0923)  Intake/Output from previous day: 04/28 0701 - 04/29 0700 In: 2124.5 [P.O.:620; I.V.:1104.5; IV Piggyback:400] Out: 1250 [Urine:1100; Blood:150] Intake/Output this shift: No intake/output data recorded.  Recent Labs    10/02/23 0437  HGB 10.9*   Recent Labs    10/02/23 0437  WBC 18.7*  RBC 3.77*  HCT 31.8*  PLT 270   Recent Labs    10/02/23 0437  NA 137  K 4.0  CL 107  CO2 22  BUN 10  CREATININE 0.57  GLUCOSE 169*  CALCIUM 8.7*   No results for input(s): "LABPT", "INR" in the last 72 hours.  EXAM General - Patient is Alert, Appropriate, and Oriented Extremity - Neurovascular intact Sensation intact distally Intact pulses distally Dorsiflexion/Plantar flexion intact No cellulitis present Compartment soft Dressing - dressing C/D/I and no drainage Motor Function - intact, moving foot and toes well on exam.   Past Medical History:  Diagnosis Date   BV (bacterial vaginosis)    Congenital dysplasia of right hip    Diabetes mellitus without complication (HCC)    Hip dislocation, right (HCC)    x2   Migraine without aura    Narcotic dependence (HCC)    currently on suboxone  due to HX of narcotic dependency- per chart review    Assessment/Plan:   1 Day Post-Op Procedure(s) (LRB): ARTHROPLASTY, HIP, TOTAL, ANTERIOR APPROACH (Right) Principal Problem:   S/P  total right hip arthroplasty  Estimated body mass index is 26.89 kg/m as calculated from the following:   Height as of this encounter: 5\' 3"  (1.6 m).   Weight as of this encounter: 68.9 kg. Advance diet Up with therapy Pain well-controlled Vital signs are stable Labs are stable Care management to assist with discharge to home with home health PT  DVT Prophylaxis - Lovenox , TED hose, and SCDs Weight-Bearing as tolerated to right leg   T. Thomos Flies, PA-C [facility] 10/02/2023, 8:03 AM

## 2023-10-02 NOTE — Discharge Instructions (Signed)

## 2023-10-02 NOTE — TOC Transition Note (Signed)
 Transition of Care [facility]) - Discharge Note   Patient Details  Name: Kristine Griffin MRN: [id number] Date of Birth: 1991-01-08  Transition of Care [facility]) CM/SW Contact:  Alexandra Ice, RN Phone Number: 10/02/2023, 9:46 AM   Clinical Narrative:     Patient to discharge today. PT recommends outpatient for rehab, patient to follow up with PCP for referral. RW delivered to bedside by [employer] yesterday 10/01/2023. No additional TOC needs identified.   Final next level of care: OP Rehab Barriers to Discharge: Barriers Resolved   Patient Goals and CMS Choice Patient states their goals for this hospitalization and ongoing recovery are:: get better          Discharge Placement                Patient to be transferred to facility by: Devin - Significant other Name of family member notified: Devin Patient and family notified of of transfer: 10/02/23  Discharge Plan and Services Additional resources added to the After Visit Summary for                  DME Arranged: Walker rolling DME Agency: [employer], NA Date DME Agency Contacted: 10/02/23 Time DME Agency Contacted: [phone] Representative spoke with at DME Agency: Montie Apley Arranged: NA          Social Drivers of Health (SDOH) Interventions SDOH Screenings   Food Insecurity: No Food Insecurity (10/01/2023)  Housing: High Risk (08/16/2023)   Received from [facility]  Transportation Needs: No Transportation Needs (08/16/2023)   Received from [facility]  Utilities: Not At Risk (08/16/2023)   Received from [facility]  Alcohol Screen: Low Risk  (09/05/2018)  Depression (PHQ2-9): Low Risk  (11/22/2021)  Financial Resource Strain: Low Risk  (08/16/2023)   Received from [facility]  Physical Activity: Inactive (12/13/2022)   Received from [facility], [facility]  Tobacco Use: Medium Risk (10/01/2023)      Readmission Risk Interventions     No data to display

## 2023-10-02 NOTE — Anesthesia Postprocedure Evaluation (Signed)
 Anesthesia Post Note  Patient: Kristine Griffin  Procedure(s) Performed: ARTHROPLASTY, HIP, TOTAL, ANTERIOR APPROACH (Right: Hip)  Patient location during evaluation: Nursing Unit Anesthesia Type: Spinal Level of consciousness: oriented and awake and alert Pain management: pain level controlled Vital Signs Assessment: post-procedure vital signs reviewed and stable Respiratory status: spontaneous breathing and respiratory function stable Cardiovascular status: blood pressure returned to baseline and stable Postop Assessment: no headache, no backache, no apparent nausea or vomiting and patient able to bend at knees Anesthetic complications: no  No notable events documented.   Last Vitals:  Vitals:   10/01/23 2005 10/02/23 0431  BP: 107/73 (!) 129/110  Pulse: 86 73  Resp: 19 19  Temp: 36.8 C 36.9 C  SpO2: 99% 100%    Last Pain:  Vitals:   10/02/23 0501  TempSrc:   PainSc: 5                  Sarahbeth Cashin B Cristy Doom

## 2023-10-12 ENCOUNTER — Ambulatory Visit
Admission: EM | Admit: 2023-10-12 | Discharge: 2023-10-12 | Disposition: A | Discharge status: Home / Self Care | Payer: MEDICAID

## 2023-10-12 DIAGNOSIS — R21 Rash and other nonspecific skin eruption: Secondary | ICD-10-CM | POA: Diagnosis not present

## 2023-10-12 MED ORDER — HYDROXYZINE HCL 25 MG PO TABS: 25.0000 mg | ORAL_TABLET | Freq: Four times a day (QID) | ORAL | 0 refills | Status: AC | PRN

## 2023-10-12 MED ORDER — HYDROXYZINE HCL 25 MG PO TABS: 25.0000 mg | ORAL_TABLET | Freq: Four times a day (QID) | ORAL | 0 refills | Status: DC | PRN

## 2023-10-12 MED ORDER — TRIAMCINOLONE ACETONIDE 0.1 % EX CREA: 1.0000 | TOPICAL_CREAM | Freq: Two times a day (BID) | CUTANEOUS | 1 refills | Status: DC

## 2023-10-12 MED ORDER — TRIAMCINOLONE ACETONIDE 0.1 % EX CREA: 1.0000 | TOPICAL_CREAM | Freq: Two times a day (BID) | CUTANEOUS | 1 refills | Status: AC

## 2023-10-12 NOTE — Discharge Instructions (Signed)
-  Start antihistamines, cool compresses and topical corticosteroids -If rash or swelling worsens contact us  as you may need oral corticosteroid but would like to avoid unless absolutely necessary since you recently  had surgery -If sudden acute worsening of rash, swelling or breathing problem call 911 or get to ER

## 2023-10-12 NOTE — ED Triage Notes (Signed)
 Pt had a total hip replacement 2 weeks ago  Pt started to have pain in her hands  Pt states the pain was starting as an intense itch that turned into lip swelling, bumps along ears, head itching  Pt states that she sat under a tree 2 days ago and was around a lot of insects  Pt states that the itching does not stop and she has pain in her hands.

## 2023-10-12 NOTE — ED Provider Notes (Signed)
 [facility]    CSN: [id number] Arrival date & time: 10/12/23  1847      History   Chief Complaint Chief Complaint  Patient presents with   Allergic Reaction    HPI Kristine Griffin is a 33 y.o. female with history of congenital dysplasia of the right hip, diabetes, polysubstance abuse (on suboxone ), and recent hip replacement (10/01/23).  Patient presents today for erythematous, papular and pruritic rash of hands, posterior neck, forehead, and upper lip that began today. She says she was in contact with some bugs at a picnic 2 days ago and started feeling itchy then. She denies intraoral swelling or shortness of breath. Patient has not had any new meds. States she is not taking the pain meds she was prescribed after the hip replacement. No change to detergents or body washes. Has not been taking any OTC meds, including Benadryl.   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/01/2023   Benzodiazepine abuse (HCC) 09/05/2018   Overdose of benzodiazepine, accidental or unintentional, initial encounter 09/05/2018   Suicide attempt (HCC) 09/05/2018   Overdose of muscle relaxant 02/10/2016   Opiate abuse, continuous (HCC) 02/10/2016   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   Opiate abuse, episodic (HCC) 10/27/2014   Cocaine abuse (HCC) 10/27/2014   Hip pain 10/27/2014   Depression 10/27/2014   Back pain 04/15/2012    Past Surgical History:  Procedure Laterality Date   TOTAL HIP ARTHROPLASTY Right 10/01/2023   Procedure: ARTHROPLASTY, HIP, TOTAL, ANTERIOR APPROACH;  Surgeon: Venus Ginsberg, MD;  Location:  [facility] ORS;  Service: Orthopedics;  Laterality: Right;    OB History     Gravida  1   Para  1   Term  1   Preterm      AB      Living  1      SAB      IAB      Ectopic      Multiple      Live Births  1            Home Medications    Prior to Admission medications   Medication Sig Start Date End Date Taking? Authorizing Provider  hydrOXYzine (ATARAX) 25 MG tablet Take 1 tablet (25 mg total) by mouth every 6 (six) hours as needed for itching. 10/12/23  Yes Floydene Hy, PA-C  traMADol  (ULTRAM ) 50 MG tablet Take 50-100 mg by mouth. 10/10/23  Yes [provider]  triamcinolone  cream (KENALOG ) 0.1 % Apply 1 Application topically 2 (two) times daily. 10/12/23  Yes Nancy Axon B, PA-C  acetaminophen  (TYLENOL ) 500 MG tablet Take 2 tablets (1,000 mg total) by mouth every 8 (eight) hours. 10/02/23   Coralyn Derry, PA-C  Aspirin-Caffeine (BC FAST PAIN RELIEF PO) Take 1 packet by mouth daily as needed (pain).    [provider]  celecoxib  (CELEBREX ) 200 MG capsule Take 1 capsule (200 mg total) by mouth 2 (two) times daily for 14 days. 10/02/23 10/16/23  Coralyn Derry, PA-C  docusate sodium  (  COLACE) 100 MG capsule Take 1 capsule (100 mg total) by mouth 2 (two) times daily. 10/02/23   Coralyn Derry, PA-C  enoxaparin  (LOVENOX ) 40 MG/0.4ML injection Inject 0.4 mLs (40 mg total) into the skin daily for 14 days. 10/02/23 10/16/23  Coralyn Derry, PA-C  gabapentin  (NEURONTIN ) 100 MG capsule Take 1 capsule (100 mg total) by mouth 2 (two) times daily. 10/02/23   Coralyn Derry, PA-C  methocarbamol  (ROBAXIN ) 500 MG tablet Take 1 tablet (500 mg total) by mouth every 6 (six) hours as needed for muscle spasms. 10/02/23   Coralyn Derry, PA-C  ondansetron  (ZOFRAN ) 4 MG tablet Take 1 tablet (4 mg total) by mouth every 6 (six) hours as needed for nausea. 10/02/23   Coralyn Derry, PA-C  SUBOXONE  8-2 MG FILM Place 0.5-1 Film under the tongue See admin instructions. Take 1  film in the morning, 1 film in the afternoon, and 0.5 film in the evening    [provider]    Family History Family History  Problem Relation Age of Onset   Diabetes Mellitus II Maternal Grandmother    Diabetes Mellitus II Maternal Grandfather     Social History Social History   Tobacco Use   Smoking status: Former    Types: Cigarettes   Smokeless tobacco: Never  Vaping Use   Vaping status: Every Day  Substance Use Topics   Alcohol use: No   Drug use: Yes    Types: Marijuana    Comment: Last use today 11/22/2021     Allergies   Patient has no known allergies.   Review of Systems Review of Systems  Constitutional:  Negative for fatigue and fever.  HENT:  Positive for facial swelling. Negative for congestion, rhinorrhea, trouble swallowing and voice change.   Respiratory:  Negative for cough, chest tightness, shortness of breath and wheezing.   Cardiovascular:  Negative for chest pain and palpitations.  Gastrointestinal:  Negative for abdominal pain and vomiting.  Musculoskeletal:  Positive for arthralgias and joint swelling.  Skin:  Positive for rash.  Neurological:  Negative for dizziness and headaches.     Physical Exam Triage Vital Signs ED Triage Vitals  Encounter Vitals Group     BP      Systolic BP Percentile      Diastolic BP Percentile      Pulse      Resp      Temp      Temp src      SpO2      Weight      Height      Head Circumference      Peak Flow      Pain Score      Pain Loc      Pain Education      Exclude from Growth Chart    No data found.  Updated Vital Signs BP 126/74 (BP Location: Left Arm)   Pulse 90   Temp 98.3 F (36.8 C) (Oral)   Ht 5\' 3"  (1.6 m)   Wt 145 lb (65.8 kg)   LMP 09/17/2023 (Approximate)   SpO2 98%   BMI 25.69 kg/m       Physical Exam Vitals and nursing note reviewed.  Constitutional:      General: She is not in acute distress.    Appearance: Normal appearance. She is not ill-appearing or  toxic-appearing.  HENT:     Head: Normocephalic and atraumatic.     Nose: Nose normal.  Mouth/Throat:     Mouth: Mucous membranes are moist.     Pharynx: Oropharynx is clear.     Comments: Mild swelling of upper left lip Eyes:     General: No scleral icterus.       Right eye: No discharge.        Left eye: No discharge.     Conjunctiva/sclera: Conjunctivae normal.  Cardiovascular:     Rate and Rhythm: Normal rate and regular rhythm.     Heart sounds: Normal heart sounds.  Pulmonary:     Effort: Pulmonary effort is normal. No respiratory distress.     Breath sounds: Normal breath sounds.  Musculoskeletal:     Cervical back: Neck supple.  Skin:    General: Skin is dry.     Findings: Rash (erythematous papules/wheals of palmar hands, left forehead and along posterior neck) present.  Neurological:     General: No focal deficit present.     Mental Status: She is alert. Mental status is at baseline.     Motor: No weakness.     Gait: Gait normal.  Psychiatric:        Mood and Affect: Mood normal.        Behavior: Behavior normal.      UC Treatments / Results  Labs (all labs ordered are listed, but only abnormal results are displayed) Labs Reviewed - No data to display  EKG   Radiology No results found.  Procedures Procedures (including critical care time)  Medications Ordered in UC Medications - No data to display  Initial Impression / Assessment and Plan / UC Course  I have reviewed the triage vital signs and the nursing notes.  Pertinent labs & imaging results that were available during my care of the patient were reviewed by me and considered in my medical decision making (see chart for details).   33 y/o female with history of congenital hip dysplasia and recent right hip replacement 11 days ago and polysubstance abuse presents for rash and pruritus that began today.  Patient says she started with itchy a couple days ago after she was in contact with some  bugs.  She says the rash started today.  Reports pain in her hands.  Has not taken any meds over-the-counter.  Cannot identify any other potential triggers.  On evaluation she has mild swelling of the upper lip and erythematous papules/wheals above palmar hands, along scalp line of posterior neck and left forehead.  May be consistent with insect bite versus development of hives.  Will treat patient at this time with hydroxyzine and topical triamcinolone  cream.  Discussed cool compresses.  Will hold off on oral corticosteroids since she had surgery a couple weeks ago and is still in the process of healing.  However, if symptoms worsen she may need corticosteroid taper.  Thoroughly reviewed return ER precautions.   Final Clinical Impressions(s) / UC Diagnoses   Final diagnoses:  Rash     Discharge Instructions      -Start antihistamines, cool compresses and topical corticosteroids -If rash or swelling worsens contact us  as you may need oral corticosteroid but would like to avoid unless absolutely necessary since you recently  had surgery -If sudden acute worsening of rash, swelling or breathing problem call 911 or get to ER   ED Prescriptions     Medication Sig Dispense Auth. Provider   triamcinolone  cream (KENALOG ) 0.1 % Apply 1 Application topically 2 (two) times daily. 30 g Floydene Hy, PA-C  hydrOXYzine (ATARAX) 25 MG tablet Take 1 tablet (25 mg total) by mouth every 6 (six) hours as needed for itching. 30 tablet Floydene Hy, PA-C      I have reviewed the PDMP during this encounter.   Floydene Hy, PA-C 10/12/23 1928
